# Patient Record
Sex: Male | Born: 1965 | Race: White | Hispanic: No | Marital: Married | Smoking: Never smoker
Health system: Southern US, Community
[De-identification: ages and names within clinical notes are randomized; demographics above are authoritative.]

## PROBLEM LIST (undated history)

## (undated) DIAGNOSIS — E119 Type 2 diabetes mellitus without complications: Secondary | ICD-10-CM

## (undated) DIAGNOSIS — I10 Essential (primary) hypertension: Secondary | ICD-10-CM

## (undated) DIAGNOSIS — E669 Obesity, unspecified: Secondary | ICD-10-CM

## (undated) DIAGNOSIS — I82409 Acute embolism and thrombosis of unspecified deep veins of unspecified lower extremity: Secondary | ICD-10-CM

## (undated) DIAGNOSIS — I2699 Other pulmonary embolism without acute cor pulmonale: Secondary | ICD-10-CM

## (undated) DIAGNOSIS — K219 Gastro-esophageal reflux disease without esophagitis: Secondary | ICD-10-CM

## (undated) DIAGNOSIS — E7800 Pure hypercholesterolemia, unspecified: Secondary | ICD-10-CM

## (undated) HISTORY — PX: HERNIA REPAIR: SHX51

## (undated) HISTORY — PX: CHOLECYSTECTOMY: SHX55

## (undated) HISTORY — PX: FOOT FRACTURE SURGERY: SHX645

## (undated) HISTORY — PX: KNEE SURGERY: SHX244

---

## 2006-10-11 ENCOUNTER — Emergency Department (HOSPITAL_COMMUNITY): Admission: EM | Admit: 2006-10-11 | Discharge: 2006-10-11 | Payer: Self-pay | Admitting: Emergency Medicine

## 2014-11-05 ENCOUNTER — Emergency Department (HOSPITAL_COMMUNITY)
Admission: EM | Admit: 2014-11-05 | Discharge: 2014-11-05 | Disposition: A | Discharge status: Home / Self Care | Payer: BC Managed Care – PPO | Attending: Emergency Medicine | Admitting: Emergency Medicine

## 2014-11-05 ENCOUNTER — Encounter (HOSPITAL_COMMUNITY): Payer: Self-pay | Admitting: *Deleted

## 2014-11-05 DIAGNOSIS — Z7952 Long term (current) use of systemic steroids: Secondary | ICD-10-CM | POA: Insufficient documentation

## 2014-11-05 DIAGNOSIS — E119 Type 2 diabetes mellitus without complications: Secondary | ICD-10-CM | POA: Insufficient documentation

## 2014-11-05 DIAGNOSIS — K219 Gastro-esophageal reflux disease without esophagitis: Secondary | ICD-10-CM | POA: Diagnosis not present

## 2014-11-05 DIAGNOSIS — L509 Urticaria, unspecified: Secondary | ICD-10-CM | POA: Insufficient documentation

## 2014-11-05 DIAGNOSIS — E7800 Pure hypercholesterolemia, unspecified: Secondary | ICD-10-CM | POA: Insufficient documentation

## 2014-11-05 DIAGNOSIS — Z79899 Other long term (current) drug therapy: Secondary | ICD-10-CM | POA: Diagnosis not present

## 2014-11-05 DIAGNOSIS — R21 Rash and other nonspecific skin eruption: Secondary | ICD-10-CM | POA: Diagnosis present

## 2014-11-05 DIAGNOSIS — E1169 Type 2 diabetes mellitus with other specified complication: Secondary | ICD-10-CM | POA: Insufficient documentation

## 2014-11-05 DIAGNOSIS — I10 Essential (primary) hypertension: Secondary | ICD-10-CM | POA: Insufficient documentation

## 2014-11-05 DIAGNOSIS — Z91010 Allergy to peanuts: Secondary | ICD-10-CM | POA: Diagnosis present

## 2014-11-05 DIAGNOSIS — T782XXA Anaphylactic shock, unspecified, initial encounter: Secondary | ICD-10-CM

## 2014-11-05 DIAGNOSIS — E669 Obesity, unspecified: Secondary | ICD-10-CM | POA: Insufficient documentation

## 2014-11-05 DIAGNOSIS — T7801XA Anaphylactic reaction due to peanuts, initial encounter: Secondary | ICD-10-CM | POA: Insufficient documentation

## 2014-11-05 HISTORY — DX: Obesity, unspecified: E66.9

## 2014-11-05 HISTORY — DX: Pure hypercholesterolemia, unspecified: E78.00

## 2014-11-05 HISTORY — DX: Essential (primary) hypertension: I10

## 2014-11-05 HISTORY — DX: Gastro-esophageal reflux disease without esophagitis: K21.9

## 2014-11-05 HISTORY — DX: Type 2 diabetes mellitus without complications: E11.9

## 2014-11-05 MED ORDER — EPINEPHRINE 0.3 MG/0.3ML IJ SOAJ
0.3000 mg | Freq: Once | INTRAMUSCULAR | Status: AC
  Administered 2014-11-05: 0.3 mg via INTRAMUSCULAR

## 2014-11-05 MED ORDER — SODIUM CHLORIDE 0.9 % IV SOLN
1000.0000 mL | Freq: Once | INTRAVENOUS | Status: AC
  Administered 2014-11-05: 1000 mL via INTRAVENOUS

## 2014-11-05 MED ORDER — METHYLPREDNISOLONE SODIUM SUCC 125 MG IJ SOLR
125.0000 mg | Freq: Once | INTRAMUSCULAR | Status: AC
  Administered 2014-11-05: 125 mg via INTRAVENOUS
  Filled 2014-11-05: qty 2

## 2014-11-05 MED ORDER — SODIUM CHLORIDE 0.9 % IV SOLN
1000.0000 mL | INTRAVENOUS | Status: DC
  Administered 2014-11-05: 1000 mL via INTRAVENOUS

## 2014-11-05 MED ORDER — FAMOTIDINE IN NACL 20-0.9 MG/50ML-% IV SOLN
20.0000 mg | Freq: Once | INTRAVENOUS | Status: AC
  Administered 2014-11-05: 20 mg via INTRAVENOUS
  Filled 2014-11-05: qty 50

## 2014-11-05 MED ORDER — DIPHENHYDRAMINE HCL 25 MG PO TABS: 25.0000 mg | ORAL_TABLET | Freq: Four times a day (QID) | ORAL | Status: DC

## 2014-11-05 MED ORDER — DIPHENHYDRAMINE HCL 50 MG/ML IJ SOLN
12.5000 mg | Freq: Once | INTRAMUSCULAR | Status: AC
  Administered 2014-11-05: 12.5 mg via INTRAVENOUS
  Filled 2014-11-05: qty 1

## 2014-11-05 MED ORDER — PREDNISONE 20 MG PO TABS: ORAL_TABLET | ORAL | Status: DC

## 2014-11-05 MED ORDER — FAMOTIDINE 20 MG PO TABS: 20.0000 mg | ORAL_TABLET | Freq: Two times a day (BID) | ORAL | Status: DC

## 2014-11-05 MED ORDER — EPINEPHRINE 0.3 MG/0.3ML IJ SOAJ
INTRAMUSCULAR | Status: AC
  Filled 2014-11-05: qty 0.3

## 2014-11-05 NOTE — ED Notes (Signed)
Patient reports that symptoms have resolved, only stated that he feels "sleepy" due to admin of Benadryl Patient in NAD Patient's wife at the bedside Side rails up, call bell in reach

## 2014-11-05 NOTE — ED Notes (Signed)
Patient states that he has an Epi pen, but did not use it

## 2014-11-05 NOTE — Discharge Instructions (Signed)
Anaphylactic Reaction An anaphylactic reaction is a sudden, severe allergic reaction. It affects the whole body. It can be life threatening. You may need to stay in the hospital.  [place] a medical bracelet or necklace that lists your allergy.  Carry your allergy kit or medicine shot to treat severe allergic reactions with you. These can save your life.  Do not drive until medicine from your shot has worn off, unless your doctor says it is okay.  If you have hives or a rash:  Take medicine as told by your doctor.  You may take over-the-counter antihistamine medicine.  Place cold cloths on your skin. Take baths in cool water. Avoid hot baths and hot showers. GET HELP RIGHT AWAY IF:   Your mouth is puffy (swollen), or you have trouble breathing.  You start making whistling sounds when you breathe (wheezing).  You have a tight feeling in your chest or throat.  You have a rash, hives, puffiness, or itching on your body.  You throw up (vomit) or have watery poop (diarrhea).  You feel dizzy or pass out (faint).  You think you are having an allergic reaction.  You have new symptoms. This is an emergency. Use your medicine shot or allergy kit as told. Call your local emergency services (911 in [place] Even if you feel better after the shot, you need to go to the hospital emergency department. MAKE SURE YOU:   Understand these instructions.  Will watch your condition.  Will get help right away if you are not doing well or get worse. Document Released: 05/31/2008 Document Revised: 06/13/2012 Document Reviewed: 03/15/2012 [facility] Patient Information 2015 [place], [place]. This information is not intended to replace advice given to you by your health care provider. Make sure you discuss any questions you have with your health care provider. Epinephrine Injection Epinephrine is a medicine given by injection to temporarily treat an emergency allergic reaction. It is also used to  treat severe asthmatic attacks and other lung problems. The medicine helps to enlarge (dilate) the small breathing tubes of the lungs. A life-threatening, sudden allergic reaction that involves the whole body is called anaphylaxis. Because of potential side effects, epinephrine should only be used as directed by your caregiver. RISKS AND COMPLICATIONS Possible side effects of epinephrine injections include: Chest pain. Irregular or rapid heartbeat. Shortness of breath. Nausea. Vomiting. Abdominal pain or cramping. Sweating. Dizziness. Weakness. Headache. Nervousness. Report all side effects to your caregiver. HOW TO GIVE AN EPINEPHRINE INJECTION Give the epinephrine injection immediately when symptoms of a severe reaction begin. Inject the medicine into the outer thigh or any available, large muscle. Your caregiver can teach you how to do this. You do not need to remove any clothing. After the injection, call your local emergency services (911 in [place] Even if you improve after the injection, you need to be examined at a hospital emergency department. Epinephrine works quickly, but it also wears off quickly. Delayed reactions can occur. A delayed reaction may be as serious and dangerous as the initial reaction. HOME CARE INSTRUCTIONS Make sure you and your family know how to give an epinephrine injection. Use epinephrine injections as directed by your caregiver. Do not use this medicine more often or in larger doses than prescribed. Always carry your epinephrine injection or anaphylaxis kit with you. This can be lifesaving if you have a severe reaction. Store the medicine in a cool, dry place. If the medicine becomes discolored or cloudy, dispose of it properly  and replace it with new medicine. Check the expiration date on your medicine. It may be unsafe to use medicines past their expiration date. Tell your caregiver about any other medicines you are taking. Some medicines can react badly  with epinephrine. Tell your caregiver about any medical conditions you have, such as diabetes, high blood pressure (hypertension), heart disease, irregular heartbeats, or if you are pregnant. SEEK IMMEDIATE MEDICAL CARE IF: You have used an epinephrine injection. Call your local emergency services (911 in [place] Even if you improve after the injection, you need to be examined at a hospital emergency department to make sure your allergic reaction is under control. You will also be monitored for adverse effects from the medicine. You have chest pain. You have irregular or fast heartbeats. You have shortness of breath. You have severe headaches. You have severe nausea, vomiting, or abdominal cramps. You have severe pain, swelling, or redness in the area where you gave the injection. Document Released: 12/10/2000 Document Revised: 03/06/2012 Document Reviewed: 09/01/2011 [facility] Patient Information 2015 [place], [place]. This information is not intended to replace advice given to you by your health care provider. Make sure you discuss any questions you have with your health care provider.

## 2014-11-05 NOTE — ED Provider Notes (Signed)
CSN: [id number]     Arrival date & time 11/05/14  [id number] History   First MD Initiated Contact with Patient 11/05/14 [phone]     Chief Complaint  Patient presents with  . Allergic Reaction     (Consider location/radiation/quality/duration/timing/severity/associated sxs/prior Treatment) Patient is a 48 y.o. male presenting with allergic reaction. The history is provided by the patient. No language interpreter was used.  Allergic Reaction Presenting symptoms: difficulty breathing, itching and rash   Presenting symptoms: no difficulty swallowing   Difficulty breathing:    Severity:  Moderate   Onset quality:  Sudden   Duration:  1 hour   Timing:  Constant   Progression:  Unchanged Itching:    Location:  Full body   Severity:  Severe   Onset quality:  Sudden   Duration:  1 hour   Timing:  Constant   Progression:  Unchanged Rash:    Location:  Arm and leg   Quality: itchiness and redness     Severity:  Moderate   Onset quality:  Sudden   Duration:  1 hour   Timing:  Constant   Progression:  Unchanged Severity:  Severe Prior allergic episodes:  Food/nut allergies Context: food   Relieved by:  Nothing Worsened by:  Nothing tried Ineffective treatments: xopenex and zyrtec.   Past Medical History  Diagnosis Date  . Diabetes mellitus without complication   . Obesity   . GERD (gastroesophageal reflux disease)   . Hypercholesteremia   . Hypertension    History reviewed. No pertinent past surgical history. History reviewed. No pertinent family history. History  Substance Use Topics  . Smoking status: Never Smoker   . Smokeless tobacco: Not on file  . Alcohol Use: No    Review of Systems  Constitutional: Negative for fever, activity change, appetite change and fatigue.  HENT: Negative for congestion, facial swelling, rhinorrhea and trouble swallowing.   Eyes: Negative for photophobia and pain.  Respiratory: Negative for cough, chest tightness and shortness of breath.    Cardiovascular: Negative for chest pain and leg swelling.  Gastrointestinal: Negative for nausea, vomiting, abdominal pain, diarrhea and constipation.  Endocrine: Negative for polydipsia and polyuria.  Genitourinary: Negative for dysuria, urgency, decreased urine volume and difficulty urinating.  Musculoskeletal: Negative for back pain and gait problem.  Skin: Positive for itching and rash. Negative for color change and wound.  Allergic/Immunologic: Negative for immunocompromised state.  Neurological: Negative for dizziness, facial asymmetry, speech difficulty, weakness, numbness and headaches.  Psychiatric/Behavioral: Negative for confusion, decreased concentration and agitation.      Allergies  Peanuts  Home Medications   Prior to Admission medications   Medication Sig Start Date End Date Taking? Authorizing Provider  cetirizine (ZYRTEC) 1 MG/ML syrup Take 5 mg by mouth daily as needed (for alleriges).   Yes Historical Provider, MD  EPINEPHrine (EPIPEN 2-PAK) 0.3 mg/0.3 mL IJ SOAJ injection Inject 0.3 mg into the muscle once.   Yes Historical Provider, MD  esomeprazole (NEXIUM) 40 MG capsule Take 40 mg by mouth at bedtime.   Yes Historical Provider, MD  Fenofibrate 150 MG CAPS Take 150 mg by mouth at bedtime.   Yes Historical Provider, MD  levalbuterol [facility](XOPENEX HFA) 45 MCG/ACT inhaler Inhale 2 puffs into the lungs every 4 (four) hours as needed for wheezing.   Yes Historical Provider, MD  SitaGLIPtin-MetFORMIN HCl (JANUMET XR) 50-1000 MG TB24 Take 1 tablet by mouth 2 (two) times daily.   Yes Historical Provider, MD  diphenhydrAMINE (BENADRYL) 25 MG tablet  Take 1 tablet (25 mg total) by mouth every 6 (six) hours. 11/05/14   Toy CookeyMegan Docherty, MD  famotidine (PEPCID) 20 MG tablet Take 1 tablet (20 mg total) by mouth 2 (two) times daily. For 3 days 11/05/14   Toy CookeyMegan Docherty, MD  predniSONE (DELTASONE) 20 MG tablet 1 tab daily for 3 days 11/05/14   Toy CookeyMegan Docherty, MD  sitaGLIPtin-metformin  (JANUMET) 50-1000 MG per tablet Take 1 tablet by mouth 2 (two) times daily with a meal.    Historical Provider, MD   BP 138/68 mmHg  Pulse 83  Temp(Src) 97.7 F (36.5 C) (Oral)  Resp 17  SpO2 100% Physical Exam  Constitutional: He is oriented to person, place, and time. He appears well-developed and well-nourished. No distress.  HENT:  Head: Normocephalic and atraumatic.  Mouth/Throat: No oropharyngeal exudate.  Eyes: Pupils are equal, round, and reactive to light.  Neck: Normal range of motion. Neck supple.  Cardiovascular: Normal rate, regular rhythm and normal heart sounds.  Exam reveals no gallop and no friction rub.   No murmur heard. Pulmonary/Chest: Effort normal and breath sounds normal. No respiratory distress. He has no wheezes. He has no rales.  Abdominal: Soft. Bowel sounds are normal. He exhibits no distension and no mass. There is no tenderness. There is no rebound and no guarding.  Musculoskeletal: Normal range of motion. He exhibits no edema or tenderness.  Neurological: He is alert and oriented to person, place, and time.  Skin: Skin is warm and dry. Rash noted. Rash is urticarial (BLUE).  Psychiatric: He has a normal mood and affect.    ED Course  Procedures (including critical care time) Labs Review Labs Reviewed - No data to display  Imaging Review No results found.   EKG Interpretation None      MDM   Final diagnoses:  Anaphylaxis, initial encounter    Pt is a 48 y.o. male with Pmhx as above who presents with anaphylaxis. He states he thinks he was exposed to peanuts at work today as he had sudden onset she throat, shortness of breath, itchy skin. He states he took 2 puffs of his Xopenex inhaler and liquid Zyrtec and has felt only mildly improved. 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 On physical exam patient is mildly tachycardic but 99% on room air.  He is coughing. Posterior oropharynx is clear and open. He has mild urticaria to bilateral upper extremities. Patient will be treated with EpiPen, IV Benadryl, IV famotidine, IV Solu-Medrol. He'll be closely monitored for change in symptom progression. If symptoms resolve he will be observed in the ED for 4 hours..  Symptoms resolved after 4 hrs. Will d/c home w/ 3 days of pred, benadryl, pepcid.       Toy CookeyMegan Docherty, MD 11/05/14 [id number]

## 2014-11-05 NOTE — ED Notes (Signed)
Patient reports that all symptoms r/t allergic reaction have resolved Patient denies SOB, itching of skin or throat or feeling as if throat is closing up RR WNL--even and unlabored with equal rise and fall of chest LCTA bilaterally Patient alert and oriented x 4 at time of DC home Wife at bedside will provide transportation

## 2014-11-05 NOTE — ED Notes (Signed)
EDP at bedside Patient also states that his arms and legs feel itchy

## 2014-11-05 NOTE — ED Notes (Signed)
Patient arrives via private vehicle Patient states that he is allergic to peanuts and that "someone did something in my office, because now my throat feels itching and like I'm choking" Patient with O2 sat of 99% on room air Patient states that he used his Xopenex inhaler and liquid Zyrtec after symptoms began, because "That's what my doctor's have told me to do." Patient reports that he is having a hard time breathing Patient able to speak in full, complete sentences without difficulty--handles secretions

## 2014-11-05 NOTE — ED Notes (Signed)
Patient reports that symptoms are beginning to resolve, but states he is "jittery" Patient informed of side effects of Epi pen--agree and v/u Patient remains 97-99% O2 sat on 2L [place] Patient in NAD Side rails up, call bell in reach Will monitor closely

## 2014-11-05 NOTE — ED Notes (Signed)
Patient continues to report that he throat is "feeling better" RR WNL--even and unlabored with equal rise and fall of chest Patient in NAD

## 2016-03-26 ENCOUNTER — Other Ambulatory Visit (HOSPITAL_COMMUNITY): Payer: Self-pay | Admitting: Specialist

## 2016-03-26 ENCOUNTER — Ambulatory Visit (HOSPITAL_COMMUNITY)
Admission: RE | Admit: 2016-03-26 | Discharge: 2016-03-26 | Disposition: A | Discharge status: Home / Self Care | Payer: 59 | Source: Ambulatory Visit | Attending: Specialist | Admitting: Specialist

## 2016-03-26 DIAGNOSIS — I10 Essential (primary) hypertension: Secondary | ICD-10-CM | POA: Insufficient documentation

## 2016-03-26 DIAGNOSIS — E119 Type 2 diabetes mellitus without complications: Secondary | ICD-10-CM | POA: Insufficient documentation

## 2016-03-26 DIAGNOSIS — M79662 Pain in left lower leg: Secondary | ICD-10-CM

## 2016-03-26 DIAGNOSIS — M7989 Other specified soft tissue disorders: Principal | ICD-10-CM

## 2016-03-26 DIAGNOSIS — K219 Gastro-esophageal reflux disease without esophagitis: Secondary | ICD-10-CM | POA: Insufficient documentation

## 2016-03-26 DIAGNOSIS — E669 Obesity, unspecified: Secondary | ICD-10-CM | POA: Diagnosis not present

## 2016-03-26 DIAGNOSIS — M79605 Pain in left leg: Secondary | ICD-10-CM | POA: Diagnosis present

## 2016-03-26 DIAGNOSIS — I8392 Asymptomatic varicose veins of left lower extremity: Secondary | ICD-10-CM | POA: Insufficient documentation

## 2016-03-26 DIAGNOSIS — E7800 Pure hypercholesterolemia, unspecified: Secondary | ICD-10-CM | POA: Diagnosis not present

## 2016-03-26 NOTE — Progress Notes (Signed)
*  PRELIMINARY RESULTS* Vascular Ultrasound Left lower extremity venous duplex has been completed.  Preliminary findings: no evidence of DVT or superficial thrombosis. Varicose veins noted in left calf.  Called results to Dr. Thomasena Edisollins.   Farrel DemarkJill Eunice, RDMS, RVT  03/26/2016, 3:56 PM

## 2017-04-28 ENCOUNTER — Ambulatory Visit: Payer: Self-pay | Admitting: Cardiology

## 2017-04-30 DIAGNOSIS — Z8249 Family history of ischemic heart disease and other diseases of the circulatory system: Secondary | ICD-10-CM | POA: Insufficient documentation

## 2017-06-09 ENCOUNTER — Ambulatory Visit: Payer: Self-pay | Admitting: Cardiology

## 2017-06-15 ENCOUNTER — Encounter: Payer: Self-pay | Admitting: Cardiology

## 2018-07-12 ENCOUNTER — Emergency Department (HOSPITAL_BASED_OUTPATIENT_CLINIC_OR_DEPARTMENT_OTHER)
Admission: EM | Admit: 2018-07-12 | Discharge: 2018-07-12 | Disposition: A | Discharge status: Home / Self Care | Payer: 59 | Attending: Emergency Medicine | Admitting: Emergency Medicine

## 2018-07-12 ENCOUNTER — Emergency Department (HOSPITAL_BASED_OUTPATIENT_CLINIC_OR_DEPARTMENT_OTHER): Payer: 59

## 2018-07-12 ENCOUNTER — Encounter (HOSPITAL_BASED_OUTPATIENT_CLINIC_OR_DEPARTMENT_OTHER): Payer: Self-pay | Admitting: Emergency Medicine

## 2018-07-12 ENCOUNTER — Other Ambulatory Visit: Payer: Self-pay

## 2018-07-12 DIAGNOSIS — I10 Essential (primary) hypertension: Secondary | ICD-10-CM | POA: Insufficient documentation

## 2018-07-12 DIAGNOSIS — Z79899 Other long term (current) drug therapy: Secondary | ICD-10-CM | POA: Diagnosis not present

## 2018-07-12 DIAGNOSIS — Z7984 Long term (current) use of oral hypoglycemic drugs: Secondary | ICD-10-CM | POA: Insufficient documentation

## 2018-07-12 DIAGNOSIS — R0789 Other chest pain: Secondary | ICD-10-CM | POA: Diagnosis present

## 2018-07-12 DIAGNOSIS — E119 Type 2 diabetes mellitus without complications: Secondary | ICD-10-CM | POA: Insufficient documentation

## 2018-07-12 DIAGNOSIS — R091 Pleurisy: Secondary | ICD-10-CM | POA: Insufficient documentation

## 2018-07-12 LAB — CBC WITH DIFFERENTIAL/PLATELET
Basophils Absolute: 0.1 10*3/uL (ref 0.0–0.1)
Basophils Relative: 1 %
EOS ABS: 0.3 10*3/uL (ref 0.0–0.7)
Eosinophils Relative: 2 %
HEMATOCRIT: 38.5 % — AB (ref 39.0–52.0)
HEMOGLOBIN: 13.6 g/dL (ref 13.0–17.0)
LYMPHS ABS: 2.8 10*3/uL (ref 0.7–4.0)
Lymphocytes Relative: 26 %
MCH: 29.4 pg (ref 26.0–34.0)
MCHC: 35.3 g/dL (ref 30.0–36.0)
MCV: 83.2 fL (ref 78.0–100.0)
Monocytes Absolute: 0.8 10*3/uL (ref 0.1–1.0)
Monocytes Relative: 8 %
NEUTROS ABS: 6.8 10*3/uL (ref 1.7–7.7)
Neutrophils Relative %: 63 %
Platelets: 289 10*3/uL (ref 150–400)
RBC: 4.63 MIL/uL (ref 4.22–5.81)
RDW: 12.8 % (ref 11.5–15.5)
WBC: 10.8 10*3/uL — ABNORMAL HIGH (ref 4.0–10.5)

## 2018-07-12 LAB — BASIC METABOLIC PANEL
ANION GAP: 8 (ref 5–15)
BUN: 13 mg/dL (ref 6–20)
CALCIUM: 8.8 mg/dL — AB (ref 8.9–10.3)
CHLORIDE: 98 mmol/L (ref 98–111)
CO2: 28 mmol/L (ref 22–32)
Creatinine, Ser: 1.01 mg/dL (ref 0.61–1.24)
GFR calc non Af Amer: >60 mL/min (ref >60)
GLUCOSE: 207 mg/dL — AB (ref 70–99)
Potassium: 3.7 mmol/L (ref 3.5–5.1)
Sodium: 134 mmol/L — ABNORMAL LOW (ref 135–145)

## 2018-07-12 LAB — TROPONIN I: Troponin I: <0.03 ng/mL (ref <0.03)

## 2018-07-12 LAB — D-DIMER, QUANTITATIVE: D-Dimer, Quant: 0.28 ug/mL-FEU (ref 0.00–0.50)

## 2018-07-12 MED ORDER — SUCRALFATE 1 GM/10ML PO SUSP
1.0000 g | Freq: Once | ORAL | Status: AC
  Administered 2018-07-12: 1 g via ORAL
  Filled 2018-07-12: qty 10

## 2018-07-12 MED ORDER — NAPROXEN 375 MG PO TABS: ORAL_TABLET | ORAL | 0 refills | Status: DC

## 2018-07-12 MED ORDER — HYDROCODONE-ACETAMINOPHEN 5-325 MG PO TABS: 1.0000 | ORAL_TABLET | Freq: Four times a day (QID) | ORAL | 0 refills | Status: DC | PRN

## 2018-07-12 MED ORDER — FENTANYL CITRATE (PF) 100 MCG/2ML IJ SOLN
100.0000 ug | Freq: Once | INTRAMUSCULAR | Status: AC
  Administered 2018-07-12: 100 ug via INTRAVENOUS
  Filled 2018-07-12: qty 2

## 2018-07-12 MED ORDER — IOPAMIDOL (ISOVUE-370) INJECTION 76%
100.0000 mL | Freq: Once | INTRAVENOUS | Status: AC | PRN
  Administered 2018-07-12: 100 mL via INTRAVENOUS

## 2018-07-12 NOTE — ED Provider Notes (Signed)
[facility] Provider Note: Terry Dell, MD, FACEP  CSN: [id number] MRN: [id number] ARRIVAL: 07/12/18 at 0050 ROOM: [facility]   CHIEF COMPLAINT  Chest Pain   HISTORY OF PRESENT ILLNESS  07/12/18 1:49 AM Terry Barnett is a 52 y.o. male who had the sudden onset of left parasternal chest pain while sitting on the couch about 10:30 PM.  He describes the pain is sharp.  It is pleuritic, worse with deep breathing and certain movements.  There is no shortness of breath but he has difficulty taking a deep breath due to the pain.  He describes the pain as an 8 out of 10 right now though it has had some waxing and waning over the past several hours.  He has had no associated nausea, diaphoresis, leg swelling or pain.  He denies recent travel.  He took 2 omeprazole capsules and 2 aspirin tablets over the past several hours without relief.   Past Medical History:  Diagnosis Date  . Diabetes mellitus without complication (HCC)   . GERD (gastroesophageal reflux disease)   . Hypercholesteremia   . Hypertension   . Obesity     Past Surgical History:  Procedure Laterality Date  . CHOLECYSTECTOMY    . FOOT FRACTURE SURGERY    . HERNIA REPAIR    . KNEE SURGERY      Family History  Problem Relation Age of Onset  . Heart attack Father   . Hypertension Father     Social History   Tobacco Use  . Smoking status: Never Smoker  . Smokeless tobacco: Never Used  Substance Use Topics  . Alcohol use: No  . Drug use: No    Prior to Admission medications   Medication Sig Start Date End Date Taking? Authorizing Provider  atorvastatin (LIPITOR) 80 MG tablet Take 80 mg by mouth daily.   Yes [provider]  esomeprazole (NEXIUM) 40 MG capsule Take 40 mg by mouth at bedtime.   Yes [provider]  lisinopril-hydrochlorothiazide (PRINZIDE,ZESTORETIC) 20-12.5 MG tablet Take 1 tablet by mouth daily.   Yes [provider]  metFORMIN (GLUCOPHAGE) 500 MG  tablet Take 1,000 mg by mouth 2 (two) times daily with a meal.   Yes [provider]  EPINEPHrine (EPIPEN 2-PAK) 0.3 mg/0.3 mL IJ SOAJ injection Inject 0.3 mg into the muscle once.    [provider]  levalbuterol Pauline Aus HFA) 45 MCG/ACT inhaler Inhale 2 puffs into the lungs every 4 (four) hours as needed for wheezing.    [provider]    Allergies Peanuts [peanut oil]   REVIEW OF SYSTEMS  Negative except as noted here or in the History of Present Illness.   PHYSICAL EXAMINATION  Initial Vital Signs Blood pressure (!) 152/94, pulse 99, temperature 98.5 F (36.9 C), temperature source Oral, resp. rate 18, height 5\' 9"  (1.753 m), weight 130.6 kg (288 lb), SpO2 99 %.  Examination General: Well-developed, well-nourished male in no acute distress; appearance consistent with age of record HENT: normocephalic; atraumatic Eyes: pupils equal, round and reactive to light; extraocular muscles intact Neck: supple Heart: regular rate and rhythm; no murmur Lungs: clear to auscultation bilaterally Chest: Nontender Abdomen: soft; nondistended; nontender; no masses or hepatosplenomegaly; bowel sounds present Extremities: No deformity; full range of motion; pulses normal; no edema; no calf tenderness Neurologic: Awake, alert and oriented; motor function intact in all extremities and symmetric; no facial droop Skin: Warm and dry Psychiatric: Normal mood and affect   RESULTS  Summary  of this visit's results, reviewed by myself:   EKG Interpretation  Date/Time:  Wednesday July 12 2018 01:00:01 EDT Ventricular Rate:  94 PR Interval:    QRS Duration: 116 QT Interval:  353 QTC Calculation: 442 R Axis:   59 Text Interpretation:  Sinus rhythm Nonspecific intraventricular conduction delay ST elevation, consider inferior injury No previous ECGs available Confirmed by Paula Libra ([id number]) on 07/12/2018 1:04:53 AM      Laboratory Studies: Results for orders placed or  performed during the hospital encounter of 07/12/18 (from the past 24 hour(s))  CBC with Differential/Platelet     Status: Abnormal   Collection Time: 07/12/18  1:23 AM  Result Value Ref Range   WBC 10.8 (H) 4.0 - 10.5 K/uL   RBC 4.63 4.22 - 5.81 MIL/uL   Hemoglobin 13.6 13.0 - 17.0 g/dL   HCT 62.1 (L) 30.8 - 65.7 %   MCV 83.2 78.0 - 100.0 fL   MCH 29.4 26.0 - 34.0 pg   MCHC 35.3 30.0 - 36.0 g/dL   RDW 84.6 96.2 - 95.2 %   Platelets 289 150 - 400 K/uL   Neutrophils Relative % 63 %   Neutro Abs 6.8 1.7 - 7.7 K/uL   Lymphocytes Relative 26 %   Lymphs Abs 2.8 0.7 - 4.0 K/uL   Monocytes Relative 8 %   Monocytes Absolute 0.8 0.1 - 1.0 K/uL   Eosinophils Relative 2 %   Eosinophils Absolute 0.3 0.0 - 0.7 K/uL   Basophils Relative 1 %   Basophils Absolute 0.1 0.0 - 0.1 K/uL  Troponin I     Status: None   Collection Time: 07/12/18  1:23 AM  Result Value Ref Range   Troponin I <0.03 <0.03 ng/mL  D-dimer, quantitative (not at [facility])     Status: None   Collection Time: 07/12/18  1:23 AM  Result Value Ref Range   D-Dimer, Quant 0.28 0.00 - 0.50 ug/mL-FEU  Basic metabolic panel     Status: Abnormal   Collection Time: 07/12/18  1:23 AM  Result Value Ref Range   Sodium 134 (L) 135 - 145 mmol/L   Potassium 3.7 3.5 - 5.1 mmol/L   Chloride 98 98 - 111 mmol/L   CO2 28 22 - 32 mmol/L   Glucose, Bld 207 (H) 70 - 99 mg/dL   BUN 13 6 - 20 mg/dL   Creatinine, Ser 8.41 0.61 - 1.24 mg/dL   Calcium 8.8 (L) 8.9 - 10.3 mg/dL   GFR calc non Af Amer >60 >60 mL/min   GFR calc Af Amer >60 >60 mL/min   Anion gap 8 5 - 15  Troponin I     Status: None   Collection Time: 07/12/18  4:28 AM  Result Value Ref Range   Troponin I <0.03 <0.03 ng/mL   Imaging Studies: Dg Chest 2 View  Result Date: 07/12/2018 CLINICAL DATA:  52 year old male with shortness of breath. EXAM: CHEST - 2 VIEW COMPARISON:  Chest radiograph dated 04/30/2017 FINDINGS: The heart size and mediastinal contours are within normal limits.  Both lungs are clear. The visualized skeletal structures are unremarkable. IMPRESSION: No active cardiopulmonary disease. Electronically Signed   By: Elgie Collard M.D.   On: 07/12/2018 01:44   Ct Angio Chest Pe W And/or Wo Contrast  Result Date: 07/12/2018 CLINICAL DATA:  52 year old male with chest pain. Concern for pulmonary embolism. EXAM: CT ANGIOGRAPHY CHEST WITH CONTRAST TECHNIQUE: Multidetector CT imaging of the chest was performed using the standard protocol during bolus  administration of intravenous contrast. Multiplanar CT image reconstructions and MIPs were obtained to evaluate the vascular anatomy. CONTRAST:  100mL ISOVUE-370 IOPAMIDOL (ISOVUE-370) INJECTION 76% COMPARISON:  Chest radiograph dated 07/12/2018 FINDINGS: Cardiovascular: There is no cardiomegaly or pericardial effusion. Coronary vascular calcification of the LAD. The thoracic aorta is unremarkable. The origins of the great vessels of the aortic arch are patent. No CT evidence of pulmonary embolism. Mediastinum/Nodes: There is no hilar or mediastinal adenopathy. Esophagus and the thyroid gland are grossly unremarkable. No mediastinal fluid collection. Lungs/Pleura: Small calcified granuloma in the left upper lobe. The lungs are clear. There is no pleural effusion or pneumothorax. The central airways are patent. Upper Abdomen: Cholecystectomy.  Fatty infiltration of the liver. Musculoskeletal: Degenerative changes of the spine. No acute osseous pathology. Review of the MIP images confirms the above findings. IMPRESSION: No acute intrathoracic pathology. No CT evidence of pulmonary embolism. Electronically Signed   By: Elgie CollardArash  Radparvar M.D.   On: 07/12/2018 03:47    ED COURSE and MDM  Nursing notes and initial vitals signs, including pulse oximetry, reviewed.  Vitals:   07/12/18 0105 07/12/18 0427 07/12/18 0430 07/12/18 0500  BP:  131/80 122/80 126/76  Pulse:  100 (!) 105 91  Resp:  17 (!) 21 (!) 21  Temp:      TempSrc:        SpO2:  98% 99% 98%  Weight: 130.6 kg (288 lb)     Height: 5\' 9"  (1.753 m)      2:38 AM Equivocal change with oral Carafate.   5:11 AM The patient's pain is pleuritic in nature.  His CT is negative for pulmonary embolism, tumor, dissection or infection.  He has had 2- troponins and an unremarkable EKG.  We will treat him for pleurisy.  [facility] [place] substances [place] reveals the patient has received 3 prescriptions for hydrocodone cough syrup and 1 prescription for hydrocodone tablets in the past 2 years.   PROCEDURES    ED DIAGNOSES     ICD-10-CM   1. Pleurisy R09.1        Paula LibraMolpus, Davyn Elsasser, MD 07/12/18 [id number]

## 2018-07-12 NOTE — ED Triage Notes (Signed)
Pt is c/o left side chest pain that started about 2.5 hrs ago while he was sitting on the couch  Pt states the pain goes into his back and neck  Pt states the pain is sharp in nature  Pt states he is unable to take a deep breath and laying down makes it worse   Pt states he had taken his medicine earlier and was going to bed but stopped in the kitchen when the pain started   Pt states he took an aspirin and an omeprozole  Pt states the pain did ease off but about an hour later it came back so he took another aspirin and omeprozole and came here

## 2018-07-12 NOTE — ED Notes (Signed)
Patient transported to X-ray 

## 2018-12-25 ENCOUNTER — Emergency Department (HOSPITAL_BASED_OUTPATIENT_CLINIC_OR_DEPARTMENT_OTHER): Payer: No Typology Code available for payment source

## 2018-12-25 ENCOUNTER — Encounter (HOSPITAL_BASED_OUTPATIENT_CLINIC_OR_DEPARTMENT_OTHER): Payer: Self-pay | Admitting: *Deleted

## 2018-12-25 ENCOUNTER — Emergency Department (HOSPITAL_BASED_OUTPATIENT_CLINIC_OR_DEPARTMENT_OTHER)
Admission: EM | Admit: 2018-12-25 | Discharge: 2018-12-26 | Disposition: A | Discharge status: Home / Self Care | Payer: No Typology Code available for payment source | Attending: Emergency Medicine | Admitting: Emergency Medicine

## 2018-12-25 ENCOUNTER — Other Ambulatory Visit: Payer: Self-pay

## 2018-12-25 DIAGNOSIS — S39012A Strain of muscle, fascia and tendon of lower back, initial encounter: Secondary | ICD-10-CM | POA: Diagnosis not present

## 2018-12-25 DIAGNOSIS — S0990XA Unspecified injury of head, initial encounter: Secondary | ICD-10-CM | POA: Diagnosis present

## 2018-12-25 DIAGNOSIS — W010XXA Fall on same level from slipping, tripping and stumbling without subsequent striking against object, initial encounter: Secondary | ICD-10-CM | POA: Diagnosis not present

## 2018-12-25 DIAGNOSIS — S161XXA Strain of muscle, fascia and tendon at neck level, initial encounter: Secondary | ICD-10-CM | POA: Insufficient documentation

## 2018-12-25 DIAGNOSIS — I10 Essential (primary) hypertension: Secondary | ICD-10-CM | POA: Diagnosis not present

## 2018-12-25 DIAGNOSIS — E119 Type 2 diabetes mellitus without complications: Secondary | ICD-10-CM | POA: Insufficient documentation

## 2018-12-25 DIAGNOSIS — Y929 Unspecified place or not applicable: Secondary | ICD-10-CM | POA: Insufficient documentation

## 2018-12-25 DIAGNOSIS — Y939 Activity, unspecified: Secondary | ICD-10-CM | POA: Insufficient documentation

## 2018-12-25 DIAGNOSIS — Y999 Unspecified external cause status: Secondary | ICD-10-CM | POA: Diagnosis not present

## 2018-12-25 DIAGNOSIS — Z79899 Other long term (current) drug therapy: Secondary | ICD-10-CM | POA: Diagnosis not present

## 2018-12-25 MED ORDER — HYDROCODONE-ACETAMINOPHEN 5-325 MG PO TABS
1.0000 | ORAL_TABLET | Freq: Once | ORAL | Status: AC
  Administered 2018-12-25: 1 via ORAL
  Filled 2018-12-25: qty 1

## 2018-12-25 NOTE — ED Notes (Signed)
Patient transported to X-ray 

## 2018-12-25 NOTE — ED Provider Notes (Signed)
[facility] EMERGENCY DEPARTMENT Provider Note   CSN: [id number] Arrival date & time: 12/25/18  1744     History   Chief Complaint Chief Complaint  Patient presents with  . Fall    HPI Manish Ruggiero III is a 52 y.o. male.  52 year old male with past medical history including hypertension, hyperlipidemia, GERD, type 2 diabetes mellitus who presents with fall.  Around 3 PM today, he was at work and slipped on a floor, falling backwards.  He struck the back of his head.  He did not lose consciousness.  He has had progressively worsening headache as well as pain on bilateral sides of his neck and low back pain.  He has been ambulatory but with pain.  He took Tylenol just after it happened.  He denies any extremity weakness or numbness.  No vision changes or vomiting.  No anticoagulant use.  The history is provided by the patient.  Fall     Past Medical History:  Diagnosis Date  . Diabetes mellitus without complication (HCC)   . GERD (gastroesophageal reflux disease)   . Hypercholesteremia   . Hypertension   . Obesity     Patient Active Problem List   Diagnosis Date Noted  . Hypertension 11/05/2014  . Hypercholesteremia 11/05/2014  . GERD (gastroesophageal reflux disease) 11/05/2014  . Diabetes (HCC) 11/05/2014  . Obesity 11/05/2014  . Allergic to peanuts 11/05/2014    Past Surgical History:  Procedure Laterality Date  . CHOLECYSTECTOMY    . FOOT FRACTURE SURGERY    . HERNIA REPAIR    . KNEE SURGERY          Home Medications    Prior to Admission medications   Medication Sig Start Date End Date Taking? Authorizing Provider  atorvastatin (LIPITOR) 80 MG tablet Take 80 mg by mouth daily.   Yes [provider]  EPINEPHrine (EPIPEN 2-PAK) 0.3 mg/0.3 mL IJ SOAJ injection Inject 0.3 mg into the muscle once.   Yes [provider]  esomeprazole (NEXIUM) 40 MG capsule Take 40 mg by mouth at bedtime.   Yes [provider]    levalbuterol (XOPENEX HFA) 45 MCG/ACT inhaler Inhale 2 puffs into the lungs every 4 (four) hours as needed for wheezing.   Yes [provider]  lisinopril-hydrochlorothiazide (PRINZIDE,ZESTORETIC) 20-12.5 MG tablet Take 1 tablet by mouth daily.   Yes [provider]  metFORMIN (GLUCOPHAGE) 500 MG tablet Take 1,000 mg by mouth 2 (two) times daily with a meal.   Yes [provider]  naproxen (NAPROSYN) 375 MG tablet Take 1 tablet twice daily as needed for chest pain. 07/12/18  Yes Molpus, John, MD  HYDROcodone-acetaminophen (NORCO) 5-325 MG tablet Take 1 tablet by mouth every 6 (six) hours as needed for severe pain. 07/12/18   Molpus, John, MD    Family History Family History  Problem Relation Age of Onset  . Heart attack Father   . Hypertension Father     Social History Social History   Tobacco Use  . Smoking status: Never Smoker  . Smokeless tobacco: Never Used  Substance Use Topics  . Alcohol use: No  . Drug use: No     Allergies   Peanuts [peanut oil]   Review of Systems Review of Systems   Physical Exam Updated Vital Signs BP 108/73 (BP Location: Right Arm)   Pulse 99   Temp 98.2 F (36.8 C) (Oral)   Resp 20   Ht 5\' 10"  (1.778 m)   Wt 124.7  kg   SpO2 99%   BMI 39.46 kg/m   Physical Exam Vitals signs and nursing note reviewed.  Constitutional:      General: He is not in acute distress.    Appearance: He is well-developed.     Comments: Awake, alert  HENT:     Head: Normocephalic and atraumatic.  Eyes:     Extraocular Movements: Extraocular movements intact.     Conjunctiva/sclera: Conjunctivae normal.     Pupils: Pupils are equal, round, and reactive to light.  Neck:     Musculoskeletal: Normal range of motion and neck supple.     Comments: Mild L paraspinal cervical muscle tenderness Cardiovascular:     Rate and Rhythm: Normal rate and regular rhythm.     Heart sounds: Normal heart sounds. No murmur.  Pulmonary:      Effort: Pulmonary effort is normal. No respiratory distress.     Breath sounds: Normal breath sounds.  Abdominal:     General: Bowel sounds are normal. There is no distension.     Palpations: Abdomen is soft.     Tenderness: There is no abdominal tenderness.  Musculoskeletal:     Comments: Tenderness across upper lumbar back without stepoff or ecchymoses  Skin:    General: Skin is warm and dry.  Neurological:     Mental Status: He is alert and oriented to person, place, and time.     Cranial Nerves: No cranial nerve deficit.     Motor: No abnormal muscle tone.     Deep Tendon Reflexes: Reflexes are normal and symmetric.     Comments: Fluent speech, normal finger-to-nose testing, negative pronator drift, no clonus 5/5 strength and normal sensation x all 4 extremities  Psychiatric:        Thought Content: Thought content normal.        Judgment: Judgment normal.      ED Treatments / Results  Labs (all labs ordered are listed, but only abnormal results are displayed) Labs Reviewed - No data to display  EKG None  Radiology Dg Lumbar Spine 2-3 Views  Result Date: 12/25/2018 CLINICAL DATA:  Fall backwards. Low back pain. EXAM: LUMBAR SPINE - 2-3 VIEW COMPARISON:  None. FINDINGS: This report assumes 5 non rib-bearing lumbar vertebrae. Lumbar vertebral body heights are preserved, with no fracture. Mild degenerative disc disease throughout the lumbar spine. No spondylolisthesis. No appreciable facet arthropathy. No aggressive appearing focal osseous lesions. IMPRESSION: 1. No lumbar spine fracture or spondylolisthesis. 2. Mild multilevel lumbar degenerative disc disease. Electronically Signed   By: Delbert PhenixJason A Poff M.D.   On: 12/25/2018 23:24   Ct Head Wo Contrast  Result Date: 12/25/2018 CLINICAL DATA:  Fall backwards with head injury. Headache. EXAM: CT HEAD WITHOUT CONTRAST TECHNIQUE: Contiguous axial images were obtained from the base of the skull through the vertex without  intravenous contrast. COMPARISON:  10/17/2008 head CT FINDINGS: Brain: No evidence of parenchymal hemorrhage or extra-axial fluid collection. No mass lesion, mass effect, or midline shift. No CT evidence of acute infarction. Cerebral volume is age appropriate. No ventriculomegaly. Vascular: No acute abnormality. Skull: No evidence of calvarial fracture. Sinuses/Orbits: No fluid levels. Mucous retention cyst versus polyp in the inferior right maxillary sinus. Other:  The mastoid air cells are unopacified. IMPRESSION: 1. No evidence of acute intracranial abnormality. No evidence of calvarial fracture. 2. Mucous retention cyst versus polyp in the inferior right maxillary sinus. Electronically Signed   By: Delbert PhenixJason A Poff M.D.   On: 12/25/2018 23:28  Ct Cervical Spine Wo Contrast  Result Date: 12/26/2018 CLINICAL DATA:  Status post fall, with neck soreness. Initial encounter. EXAM: CT CERVICAL SPINE WITHOUT CONTRAST TECHNIQUE: Multidetector CT imaging of the cervical spine was performed without intravenous contrast. Multiplanar CT image reconstructions were also generated. COMPARISON:  Cervical spine radiographs performed 10/17/2008 FINDINGS: Alignment: Normal. Skull base and vertebrae: No acute fracture. No primary bone lesion or focal pathologic process. Soft tissues and spinal canal: No prevertebral fluid or swelling. No visible canal hematoma. Disc levels: Intervertebral disc spaces are preserved. A prominent osteophyte is noted on the left at C2-C3, with narrowing of the associated bony foramen. Remaining visualized bony foramina are preserved. Upper chest: The visualized lung apices are clear. Mild calcification is noted at the carotid bifurcations bilaterally. The thyroid gland is unremarkable. Other: No additional soft tissue abnormalities are seen. IMPRESSION: 1. No evidence of fracture or subluxation along the cervical spine. 2. Prominent osteophyte on the left at C2-C3, with narrowing of the associated  bony foramen. 3. Mild calcification at the carotid bifurcations bilaterally. Carotid ultrasound could be considered for further evaluation, as deemed clinically appropriate. Electronically Signed   By: Roanna RaiderJeffery  Chang M.D.   On: 12/26/2018 00:21    Procedures Procedures (including critical care time)  Medications Ordered in ED Medications  HYDROcodone-acetaminophen (NORCO/VICODIN) 5-325 MG per tablet 1 tablet (1 tablet Oral Given 12/25/18 2302)     Initial Impression / Assessment and Plan / ED Course  I have reviewed the triage vital signs and the nursing notes.  Pertinent labs & imaging results that were available during my care of the patient were reviewed by me and considered in my medical decision making (see chart for details).    Normal neurologic exam and reassuring vital signs.  No external signs of trauma.  Obtain CT of head and C-spine which were negative for acute injury.  Also obtained lumbar plain films which were negative.  I have discussed supportive measures and discussed the possibility of a mild concussion.  I discussed expected course for postconcussive syndrome.  I extensively reviewed return precautions.  Patient voiced understanding.  Final Clinical Impressions(s) / ED Diagnoses   Final diagnoses:  Closed head injury, initial encounter  Strain of neck muscle, initial encounter  Strain of lumbar region, initial encounter    ED Discharge Orders    None       Tiffanyann Deroo, Ambrose Finlandachel Morgan, MD 12/26/18 [phone]

## 2018-12-25 NOTE — ED Triage Notes (Signed)
He slipped on a wet spot. He landed on his back. Lower back pain, headache, neck and shoulder soreness. He took Tylenol with no relief. No LOC. He is ambulatory. Alert oriented.

## 2018-12-25 NOTE — ED Notes (Signed)
Returned from xray

## 2018-12-26 NOTE — ED Notes (Signed)
ED Provider at bedside. 

## 2020-07-03 IMAGING — DX DG CHEST 2V
2 series · 2 of 2 positions shown · non-contrast
Comparison: Chest radiograph dated 04/30/2017

CLINICAL DATA: 52-year-old male with shortness of breath.

EXAM:
CHEST - 2 VIEW

[chest pa]
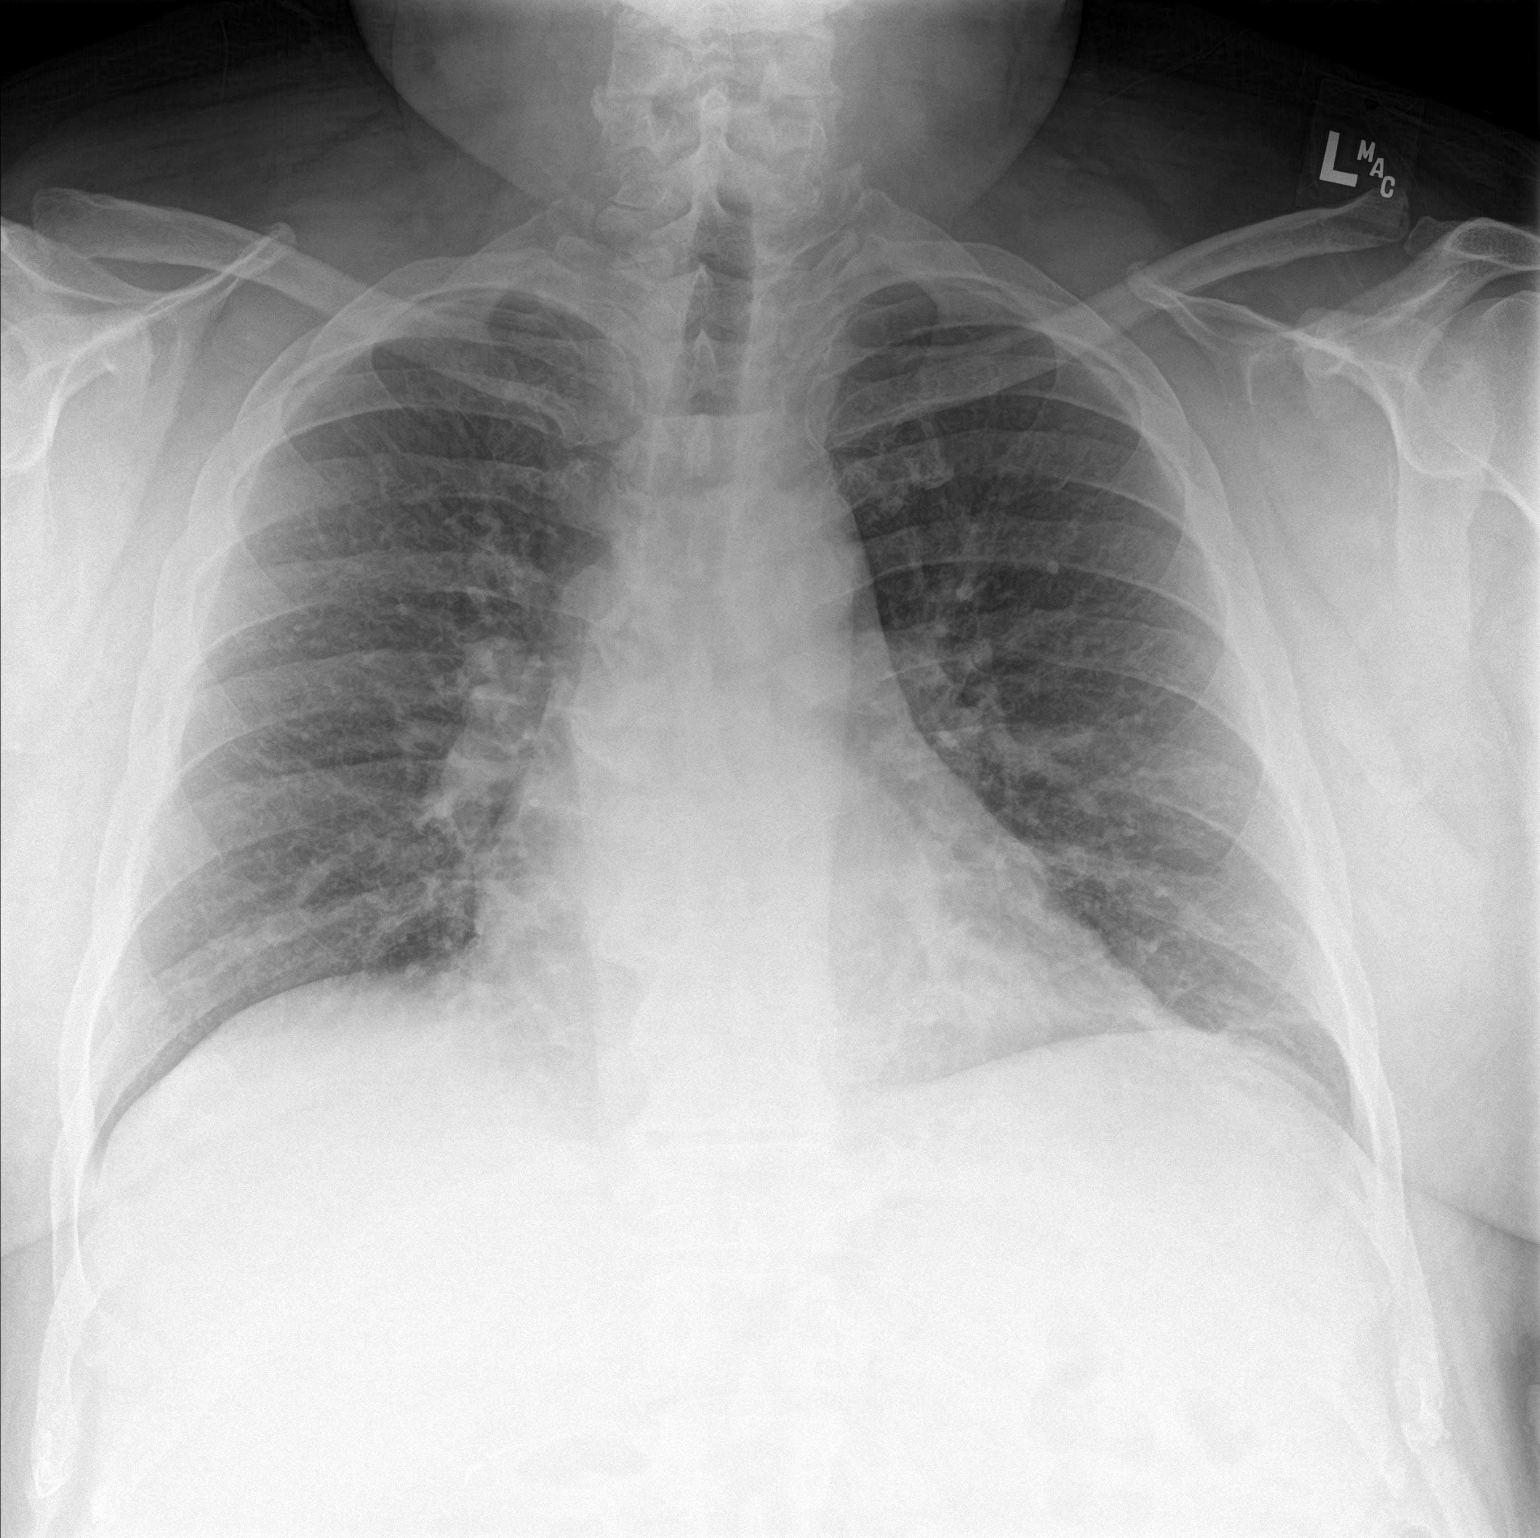

[chest lat]
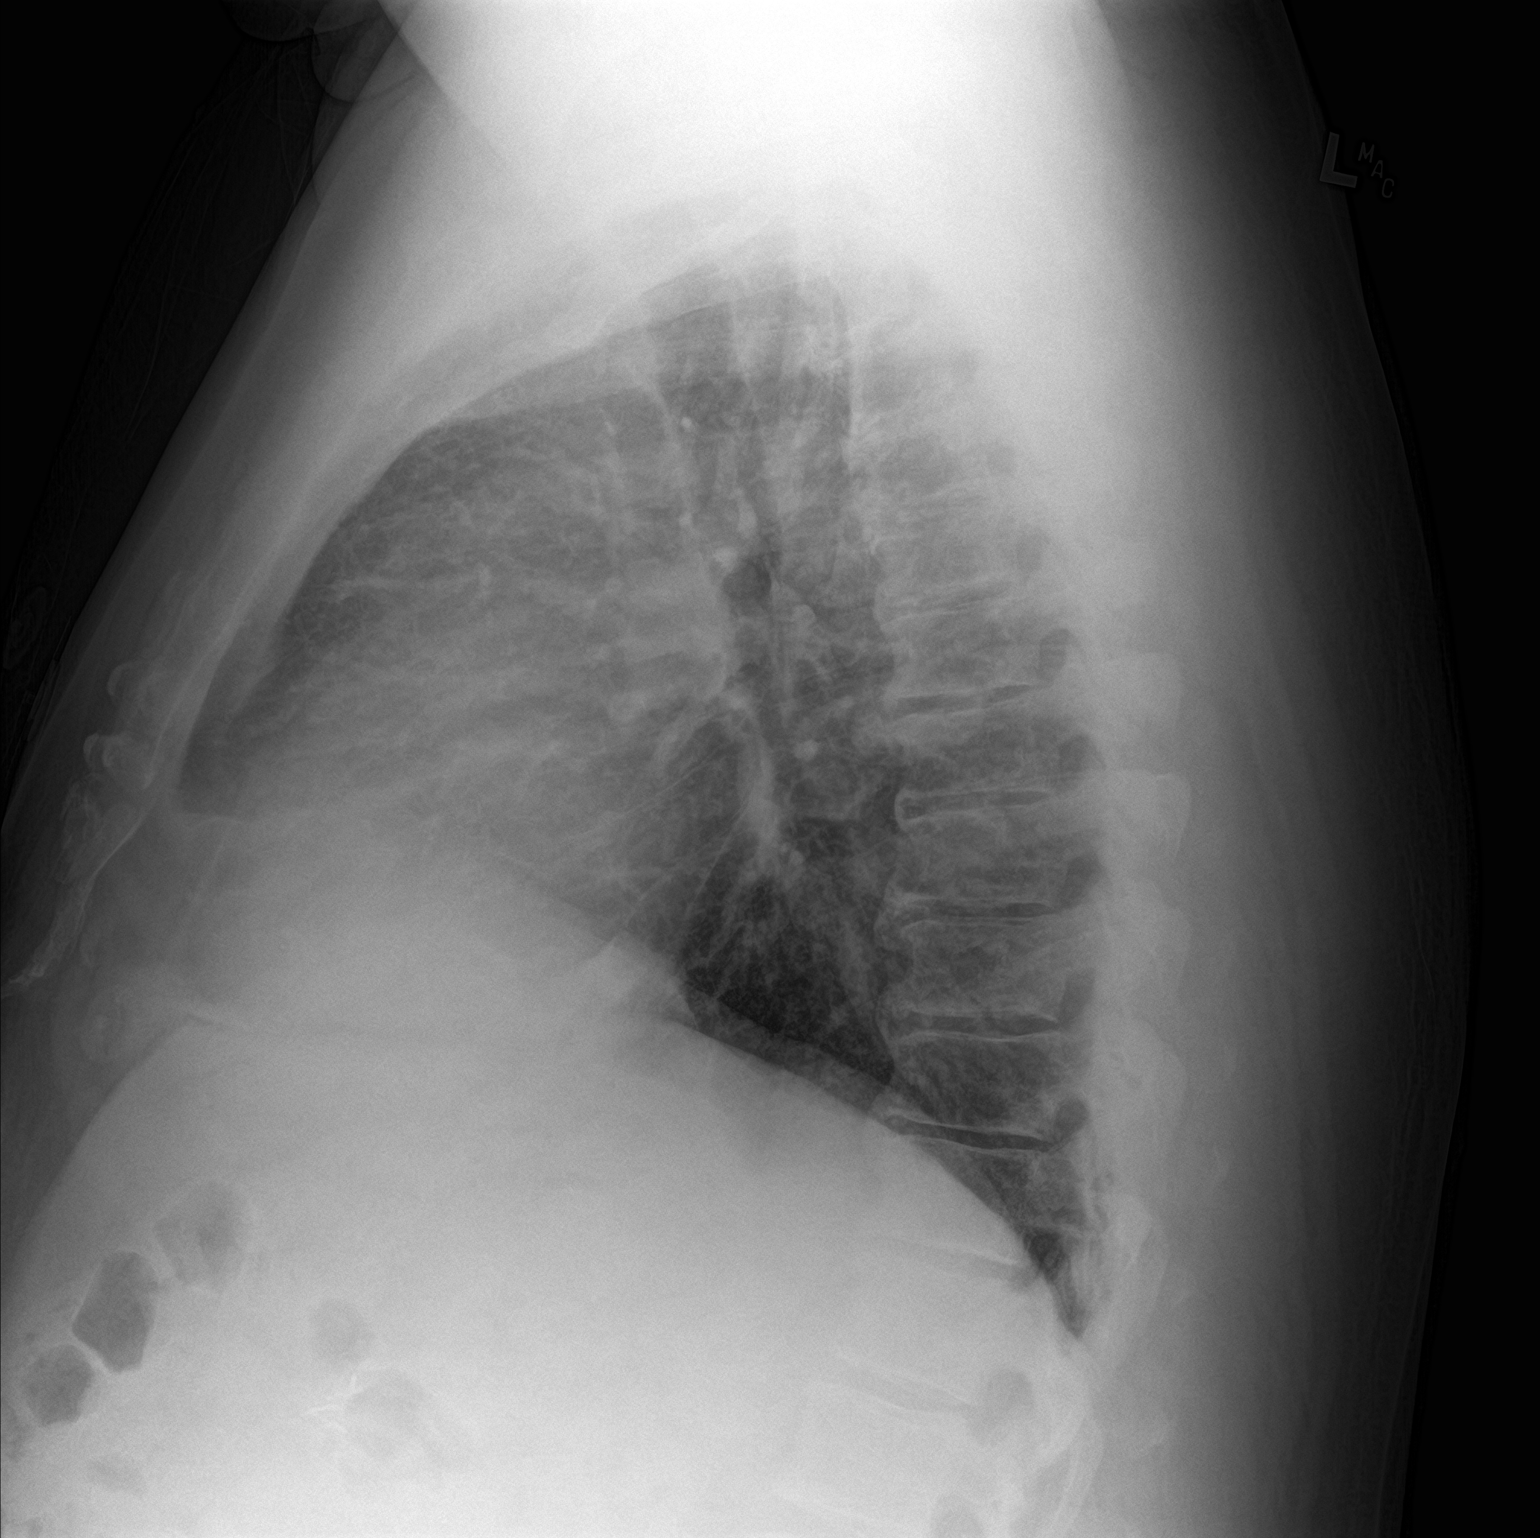

[2 of 2 positions shown; findings below may reference images not displayed]

FINDINGS: The heart size and mediastinal contours are within normal limits.
Both lungs are clear. The visualized skeletal structures are
unremarkable.
IMPRESSION: No active cardiopulmonary disease.

## 2020-12-17 IMAGING — CT CT CERVICAL SPINE W/O CM
3 of 4 series · 12 of 33 positions shown, 14 images · non-contrast
Comparison: Cervical spine radiographs performed 10/17/2008

CLINICAL DATA: Status post fall, with neck soreness. Initial
encounter.

EXAM:
CT CERVICAL SPINE WITHOUT CONTRAST
TECHNIQUE: Multidetector CT imaging of the cervical spine was performed without
intravenous contrast. Multiplanar CT image reconstructions were also
generated.

[Series 2: sagittal bone · sagittal · 0.35mm/px · 5 of 114 slices shown, 6 images]
[im 38/114  bone]
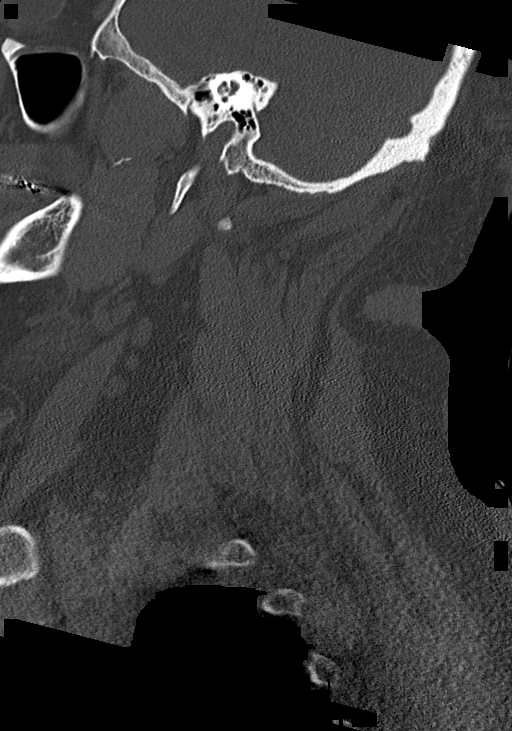
[im 48/114  bone]
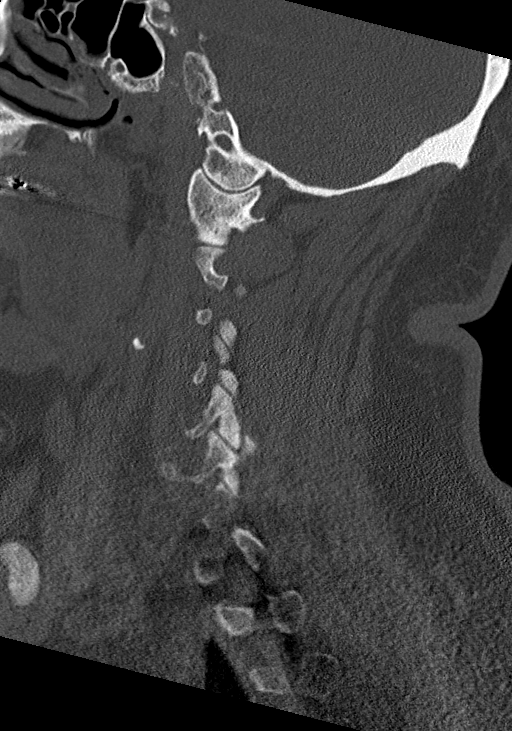
[im 57/114  soft-tissue]
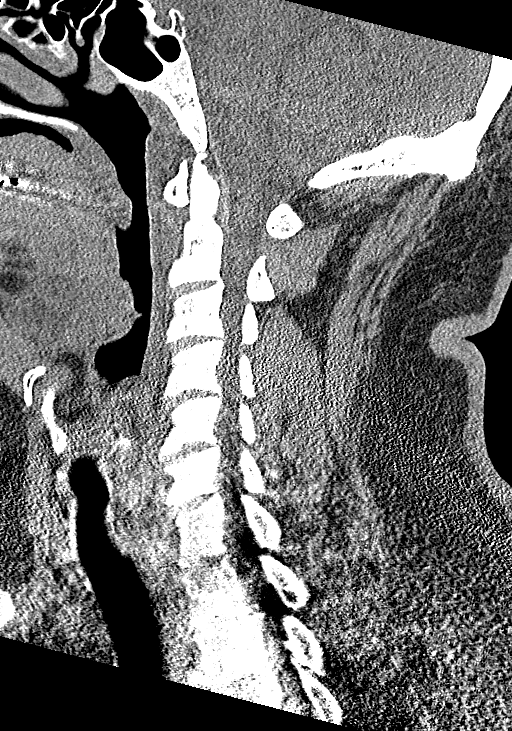
[im 57/114  bone]
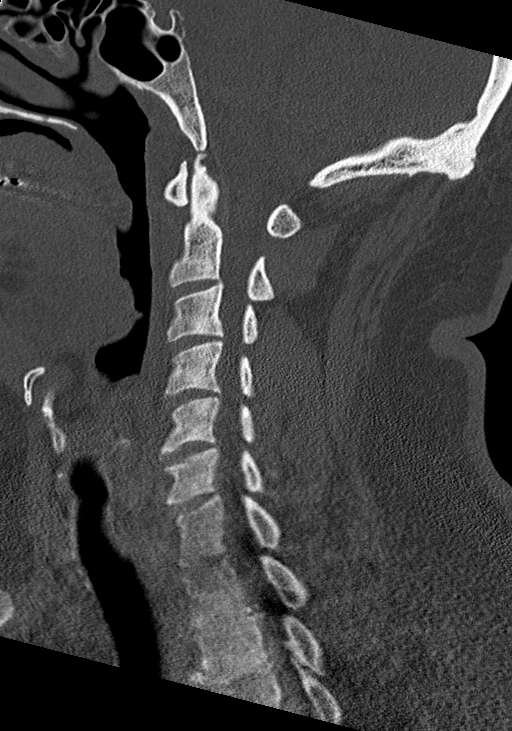
[im 66/114  bone]
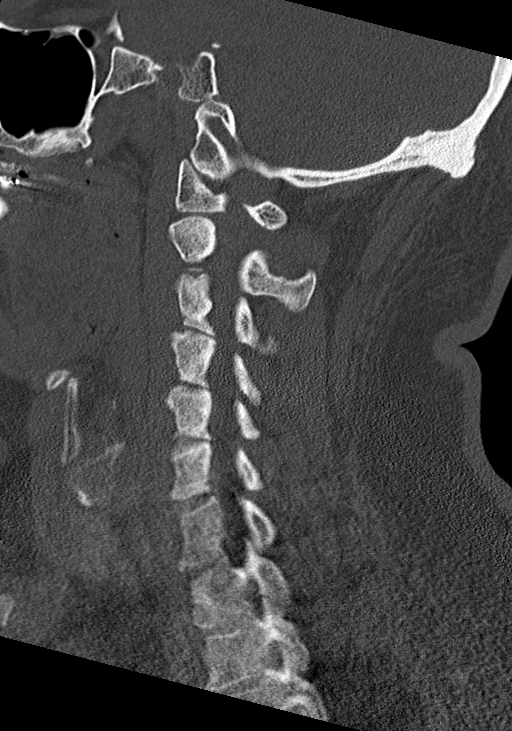
[im 76/114  bone]
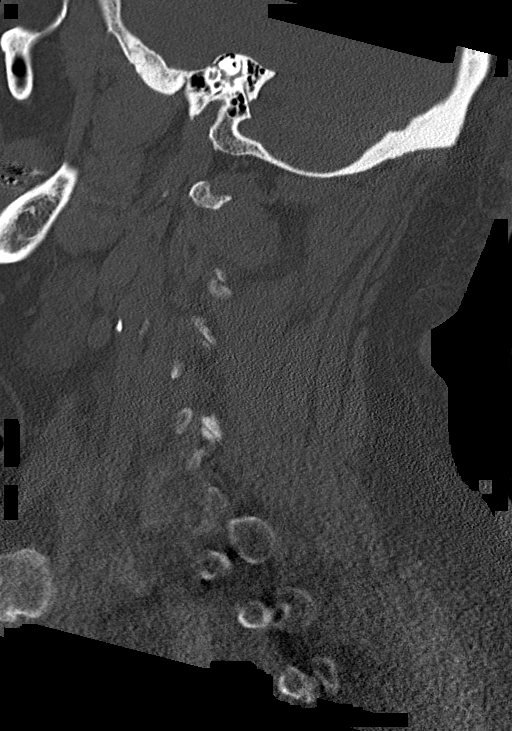

[Series 3: coronal bone · coronal · 0.44mm/px · 3 of 91 slices shown]
[im 19/91  bone]
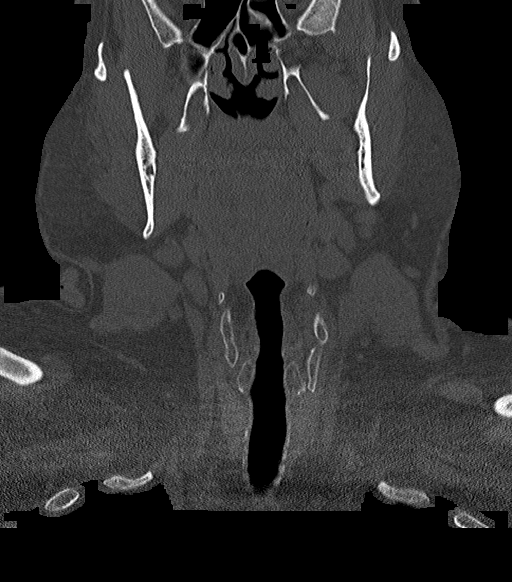
[im 37/91  bone]
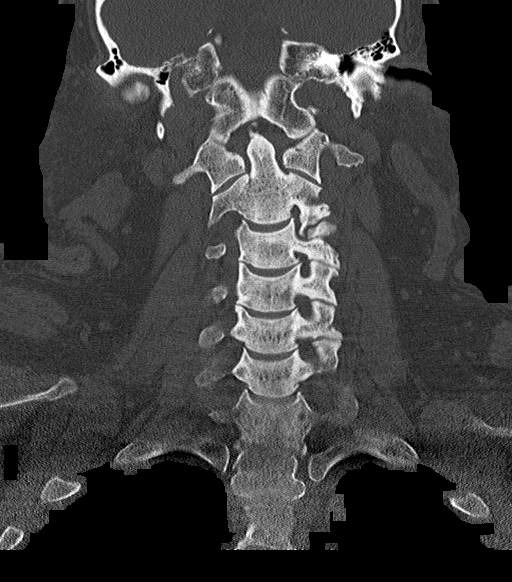
[im 55/91  bone]
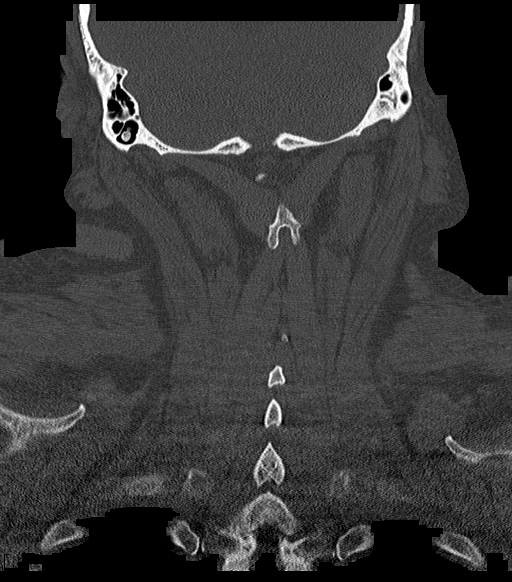

[Series 4: orthogonal bone · axial · 0.35mm/px · z∈[+891,+1055]mm · 4 of 129 slices shown, 5 images]
[im 22/129  soft-tissue]
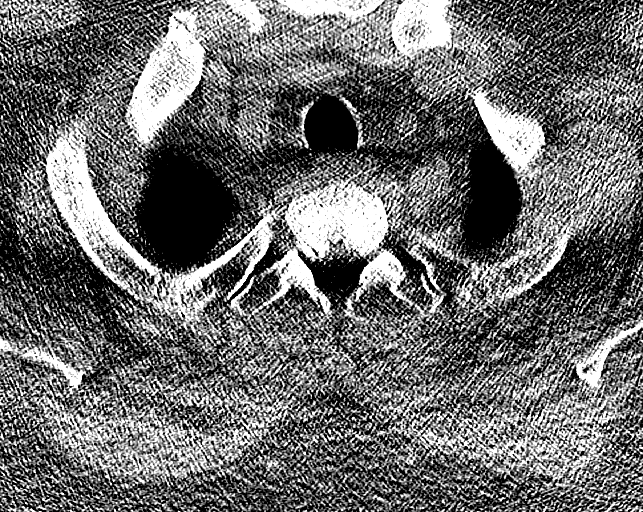
[im 22/129  bone]
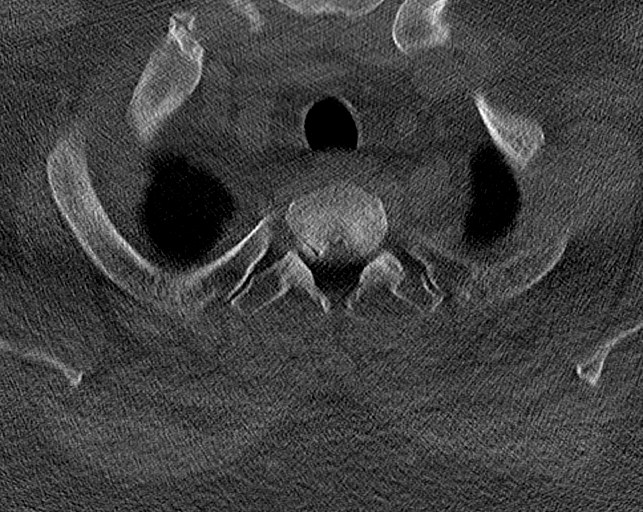
[im 43/129  bone]
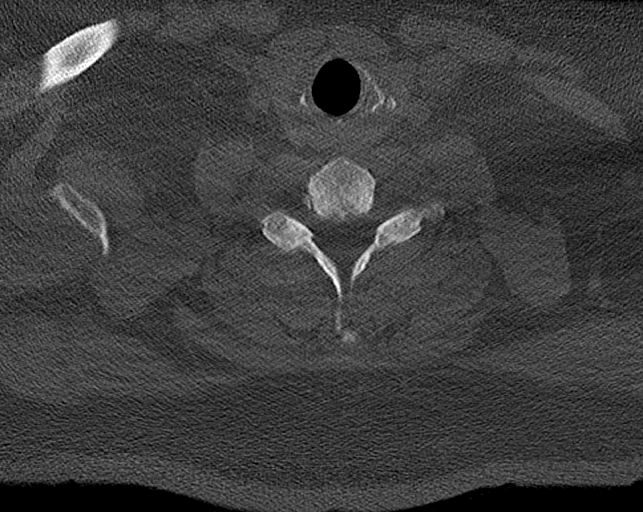
[im 86/129  bone]
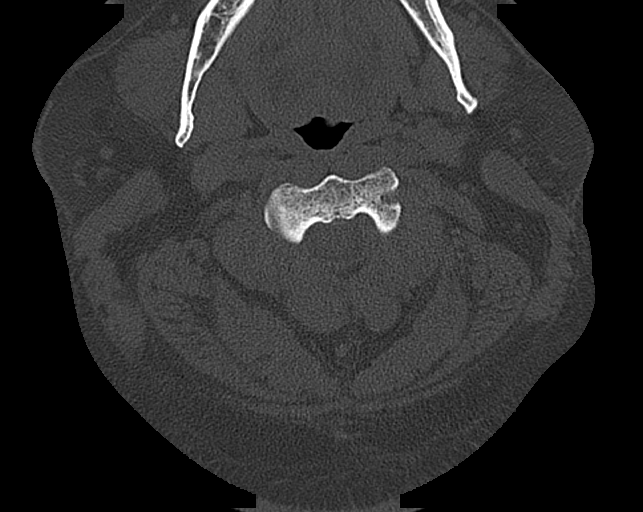
[im 107/129  bone]
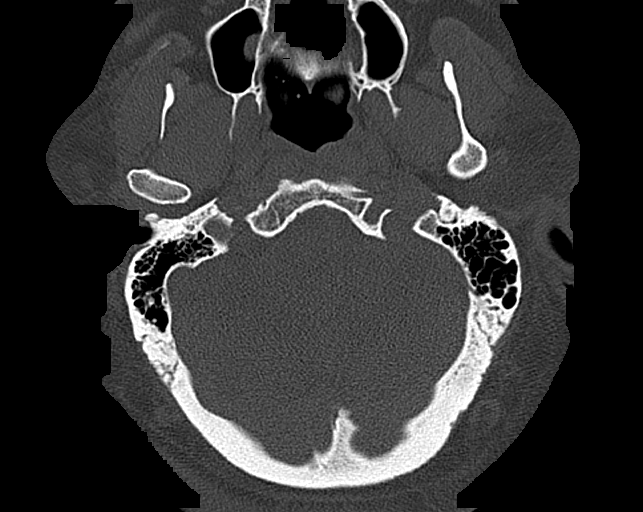

[12 of 33 positions shown; findings below may reference images not displayed]

FINDINGS: Alignment: Normal.

Skull base and vertebrae: No acute fracture. No primary bone lesion
or focal pathologic process.

Soft tissues and spinal canal: No prevertebral fluid or swelling. No
visible canal hematoma.

Disc levels: Intervertebral disc spaces are preserved. A prominent
osteophyte is noted on the left at C2-C3, with narrowing of the
associated bony foramen. Remaining visualized bony foramina are
preserved.

Upper chest: The visualized lung apices are clear. Mild
calcification is noted at the carotid bifurcations bilaterally. The
thyroid gland is unremarkable.

Other: No additional soft tissue abnormalities are seen.
IMPRESSION: 1. No evidence of fracture or subluxation along the cervical spine.
2. Prominent osteophyte on the left at C2-C3, with narrowing of the
associated bony foramen.
3. Mild calcification at the carotid bifurcations bilaterally.
Carotid ultrasound could be considered for further evaluation, as
deemed clinically appropriate.

## 2022-06-16 DIAGNOSIS — M2062 Acquired deformities of toe(s), unspecified, left foot: Secondary | ICD-10-CM | POA: Insufficient documentation

## 2022-10-04 ENCOUNTER — Encounter (HOSPITAL_BASED_OUTPATIENT_CLINIC_OR_DEPARTMENT_OTHER): Payer: Self-pay | Admitting: Emergency Medicine

## 2022-10-04 ENCOUNTER — Emergency Department (HOSPITAL_BASED_OUTPATIENT_CLINIC_OR_DEPARTMENT_OTHER)
Admission: EM | Admit: 2022-10-04 | Discharge: 2022-10-04 | Disposition: A | Discharge status: Home / Self Care | Payer: 59 | Attending: Emergency Medicine | Admitting: Emergency Medicine

## 2022-10-04 ENCOUNTER — Other Ambulatory Visit: Payer: Self-pay

## 2022-10-04 ENCOUNTER — Emergency Department (HOSPITAL_BASED_OUTPATIENT_CLINIC_OR_DEPARTMENT_OTHER): Payer: 59

## 2022-10-04 DIAGNOSIS — R1084 Generalized abdominal pain: Secondary | ICD-10-CM | POA: Diagnosis not present

## 2022-10-04 DIAGNOSIS — R109 Unspecified abdominal pain: Secondary | ICD-10-CM | POA: Diagnosis present

## 2022-10-04 DIAGNOSIS — R110 Nausea: Secondary | ICD-10-CM | POA: Insufficient documentation

## 2022-10-04 DIAGNOSIS — Z7984 Long term (current) use of oral hypoglycemic drugs: Secondary | ICD-10-CM | POA: Diagnosis not present

## 2022-10-04 DIAGNOSIS — Z91010 Allergy to peanuts: Secondary | ICD-10-CM | POA: Diagnosis not present

## 2022-10-04 DIAGNOSIS — I10 Essential (primary) hypertension: Secondary | ICD-10-CM | POA: Diagnosis not present

## 2022-10-04 DIAGNOSIS — Z79899 Other long term (current) drug therapy: Secondary | ICD-10-CM | POA: Insufficient documentation

## 2022-10-04 DIAGNOSIS — E119 Type 2 diabetes mellitus without complications: Secondary | ICD-10-CM | POA: Insufficient documentation

## 2022-10-04 DIAGNOSIS — R197 Diarrhea, unspecified: Secondary | ICD-10-CM

## 2022-10-04 LAB — COMPREHENSIVE METABOLIC PANEL
ALT: 23 U/L (ref 0–44)
AST: 24 U/L (ref 15–41)
Albumin: 4.2 g/dL (ref 3.5–5.0)
Alkaline Phosphatase: 44 U/L (ref 38–126)
Anion gap: 8 (ref 5–15)
BUN: 15 mg/dL (ref 6–20)
CO2: 23 mmol/L (ref 22–32)
Calcium: 9 mg/dL (ref 8.9–10.3)
Chloride: 104 mmol/L (ref 98–111)
Creatinine, Ser: 0.92 mg/dL (ref 0.61–1.24)
GFR, Estimated: >60 mL/min (ref >60)
Glucose, Bld: 93 mg/dL (ref 70–99)
Potassium: 3.7 mmol/L (ref 3.5–5.1)
Sodium: 135 mmol/L (ref 135–145)
Total Bilirubin: 0.8 mg/dL (ref 0.3–1.2)
Total Protein: 8.4 g/dL — ABNORMAL HIGH (ref 6.5–8.1)

## 2022-10-04 LAB — URINALYSIS, ROUTINE W REFLEX MICROSCOPIC
Bilirubin Urine: NEGATIVE
Glucose, UA: NEGATIVE mg/dL
Hgb urine dipstick: NEGATIVE
Ketones, ur: NEGATIVE mg/dL
Leukocytes,Ua: NEGATIVE
Nitrite: NEGATIVE
Protein, ur: NEGATIVE mg/dL
Specific Gravity, Urine: 1.025 (ref 1.005–1.030)
pH: 5.5 (ref 5.0–8.0)

## 2022-10-04 LAB — LIPASE, BLOOD: Lipase: 31 U/L (ref 11–51)

## 2022-10-04 LAB — CBC WITH DIFFERENTIAL/PLATELET
Abs Immature Granulocytes: 0.04 10*3/uL (ref 0.00–0.07)
Basophils Absolute: 0.1 10*3/uL (ref 0.0–0.1)
Basophils Relative: 0 %
Eosinophils Absolute: 0.1 10*3/uL (ref 0.0–0.5)
Eosinophils Relative: 1 %
HCT: 48.6 % (ref 39.0–52.0)
Hemoglobin: 16.7 g/dL (ref 13.0–17.0)
Immature Granulocytes: 0 %
Lymphocytes Relative: 19 %
Lymphs Abs: 2.4 10*3/uL (ref 0.7–4.0)
MCH: 28.7 pg (ref 26.0–34.0)
MCHC: 34.4 g/dL (ref 30.0–36.0)
MCV: 83.6 fL (ref 80.0–100.0)
Monocytes Absolute: 0.8 10*3/uL (ref 0.1–1.0)
Monocytes Relative: 6 %
Neutro Abs: 9.1 10*3/uL — ABNORMAL HIGH (ref 1.7–7.7)
Neutrophils Relative %: 74 %
Platelets: 325 10*3/uL (ref 150–400)
RBC: 5.81 MIL/uL (ref 4.22–5.81)
RDW: 12.7 % (ref 11.5–15.5)
WBC: 12.5 10*3/uL — ABNORMAL HIGH (ref 4.0–10.5)
nRBC: 0 % (ref 0.0–0.2)

## 2022-10-04 MED ORDER — IOHEXOL 300 MG/ML  SOLN
100.0000 mL | Freq: Once | INTRAMUSCULAR | Status: AC | PRN
  Administered 2022-10-04: 100 mL via INTRAVENOUS

## 2022-10-04 MED ORDER — MORPHINE SULFATE (PF) 4 MG/ML IV SOLN
4.0000 mg | Freq: Once | INTRAVENOUS | Status: AC
  Administered 2022-10-04: 4 mg via INTRAVENOUS
  Filled 2022-10-04: qty 1

## 2022-10-04 MED ORDER — ONDANSETRON HCL 4 MG/2ML IJ SOLN
4.0000 mg | Freq: Once | INTRAMUSCULAR | Status: AC
  Administered 2022-10-04: 4 mg via INTRAVENOUS
  Filled 2022-10-04: qty 2

## 2022-10-04 MED ORDER — DICYCLOMINE HCL 10 MG PO CAPS
10.0000 mg | ORAL_CAPSULE | Freq: Once | ORAL | Status: AC
  Administered 2022-10-04: 10 mg via ORAL
  Filled 2022-10-04: qty 1

## 2022-10-04 MED ORDER — DICYCLOMINE HCL 20 MG PO TABS: 20.0000 mg | ORAL_TABLET | Freq: Two times a day (BID) | ORAL | 0 refills | Status: DC

## 2022-10-04 MED ORDER — ONDANSETRON 4 MG PO TBDP: 4.0000 mg | ORAL_TABLET | Freq: Three times a day (TID) | ORAL | 0 refills | Status: DC | PRN

## 2022-10-04 NOTE — Discharge Instructions (Signed)
Pleasure taking care of you here in the emergency department today.  Your labs and imaging are reassuring.  I written you a few medications to help with your symptoms.  If they worsen, you develop fever, persistent vomiting, blood in your stool please seek reevaluation.

## 2022-10-04 NOTE — ED Triage Notes (Signed)
Nausea, diarrhea, and constant lower abdominal cramps since last night. Pain worsens before having a BM. Unable to tolerate PO intake. Sent here by PCP.

## 2022-10-04 NOTE — ED Provider Notes (Signed)
[facility] EMERGENCY DEPARTMENT Provider Note   CSN: [id number] Arrival date & time: 10/04/22  1811    History  Chief Complaint  Patient presents with   Abdominal Pain   Diarrhea    Terry Barnett is a 56 y.o. male history of hypertension, hypercholesterolemia, diabetes, GERD here for evaluation of abdominal pain, nausea and diarrhea.  Symptoms started yesterday.  Diffuse lower abdominal cramping.  Has had prior colonoscopy without any significant findings per patient.  No history of diverticulitis.  Still has appendix.  No dysuria hematuria.  He has had nausea without vomiting.  Pain currently rated an 8/10.  No recent antibiotics or travel.  Granddaughter did have a stomach bug last week however her symptoms had resolved prior to symptoms starting.  He has no back pain, numbness or weakness.  No melena or blood per rectum.  Prior open abd surgery for perf pancreatic duct and cystic duct after lap chole. Prior umbilical hernia repair  HPI     Home Medications Prior to Admission medications   Medication Sig Start Date End Date Taking? Authorizing Provider  dicyclomine (BENTYL) 20 MG tablet Take 1 tablet (20 mg total) by mouth 2 (two) times daily. 10/04/22  Yes Glen Blatchley A, PA-C  ondansetron (ZOFRAN-ODT) 4 MG disintegrating tablet Take 1 tablet (4 mg total) by mouth every 8 (eight) hours as needed for nausea or vomiting. 10/04/22  Yes Endrit Gittins A, PA-C  atorvastatin (LIPITOR) 80 MG tablet Take 80 mg by mouth daily.    [provider]  EPINEPHrine (EPIPEN 2-PAK) 0.3 mg/0.3 mL IJ SOAJ injection Inject 0.3 mg into the muscle once.    [provider]  esomeprazole (NEXIUM) 40 MG capsule Take 40 mg by mouth at bedtime.    [provider]  HYDROcodone-acetaminophen (NORCO) 5-325 MG tablet Take 1 tablet by mouth every 6 (six) hours as needed for severe pain. 07/12/18   Molpus, John, MD  levalbuterol [facility] HFA) 45 MCG/ACT inhaler Inhale 2  puffs into the lungs every 4 (four) hours as needed for wheezing.    [provider]  lisinopril-hydrochlorothiazide (PRINZIDE,ZESTORETIC) 20-12.5 MG tablet Take 1 tablet by mouth daily.    [provider]  metFORMIN (GLUCOPHAGE) 500 MG tablet Take 1,000 mg by mouth 2 (two) times daily with a meal.    [provider]  naproxen (NAPROSYN) 375 MG tablet Take 1 tablet twice daily as needed for chest pain. 07/12/18   Molpus, Jonny Ruiz, MD      Allergies    Peanuts [peanut oil], Peanut-containing drug products, and Sitagliptin-metformin hcl    Review of Systems   Review of Systems  Constitutional: Negative.   HENT: Negative.    Respiratory: Negative.    Cardiovascular: Negative.   Gastrointestinal:  Positive for abdominal pain, diarrhea and nausea. Negative for abdominal distention, anal bleeding, blood in stool, constipation, rectal pain and vomiting.  Genitourinary: Negative.   Musculoskeletal: Negative.   Neurological: Negative.   All other systems reviewed and are negative.  Physical Exam Updated Vital Signs BP (!) 156/94 (BP Location: Left Arm)   Pulse 87   Temp 98.4 F (36.9 C) (Oral)   Resp 20   Ht 5\' 10"  (1.778 m)   Wt 122.5 kg   SpO2 100%   BMI 38.74 kg/m  Physical Exam Vitals and nursing note reviewed.  Constitutional:      General: He is not in acute distress.    Appearance: He is well-developed. He is not ill-appearing, toxic-appearing  or diaphoretic.  HENT:     Head: Normocephalic and atraumatic.  Eyes:     Pupils: Pupils are equal, round, and reactive to light.  Cardiovascular:     Rate and Rhythm: Normal rate and regular rhythm.     Heart sounds: Normal heart sounds.  Pulmonary:     Effort: Pulmonary effort is normal. No respiratory distress.     Breath sounds: Normal breath sounds.  Abdominal:     General: Bowel sounds are normal. There is no distension.     Palpations: Abdomen is soft.     Tenderness: There is abdominal tenderness in  the right lower quadrant, suprapubic area and left lower quadrant. There is no right CVA tenderness, left CVA tenderness, guarding or rebound. Negative signs include Murphy's sign and McBurney's sign.  Musculoskeletal:        General: Normal range of motion.     Cervical back: Normal range of motion and neck supple.  Skin:    General: Skin is warm and dry.  Neurological:     General: No focal deficit present.     Mental Status: He is alert and oriented to person, place, and time.    ED Results / Procedures / Treatments   Labs (all labs ordered are listed, but only abnormal results are displayed) Labs Reviewed  COMPREHENSIVE METABOLIC PANEL - Abnormal; Notable for the following components:      Result Value   Total Protein 8.4 (*)    All other components within normal limits  CBC WITH DIFFERENTIAL/PLATELET - Abnormal; Notable for the following components:   WBC 12.5 (*)    Neutro Abs 9.1 (*)    All other components within normal limits  LIPASE, BLOOD  URINALYSIS, ROUTINE W REFLEX MICROSCOPIC    EKG None  Radiology CT ABDOMEN PELVIS W CONTRAST  Result Date: 10/04/2022 CLINICAL DATA:  Nausea, diarrhea, and constant lower abdominal cramps. EXAM: CT ABDOMEN AND PELVIS WITH CONTRAST TECHNIQUE: Multidetector CT imaging of the abdomen and pelvis was performed using the standard protocol following bolus administration of intravenous contrast. RADIATION DOSE REDUCTION: This exam was performed according to the departmental dose-optimization program which includes automated exposure control, adjustment of the mA and/or kV according to patient size and/or use of iterative reconstruction technique. CONTRAST:  OMNIPAQUE IOHEXOL 300 MG/ML  SOLN COMPARISON:  None Available. FINDINGS: Lower chest: Scattered coronary artery calcifications are noted. Hepatobiliary: No focal liver abnormality is seen. Status post cholecystectomy. No biliary dilatation. Pancreas: Unremarkable. No pancreatic ductal  dilatation or surrounding inflammatory changes. Spleen: Normal in size without focal abnormality. Adrenals/Urinary Tract: The adrenal glands are normal. The kidneys enhance symmetrically. No renal calculus or hydronephrosis. The bladder is unremarkable. Stomach/Bowel: Stomach is within normal limits. Appendix appears normal. No evidence of bowel wall thickening, distention, or inflammatory changes. No free air or pneumatosis. Vascular/Lymphatic: Aortic atherosclerosis. No enlarged abdominal or pelvic lymph nodes. Reproductive: Prostate is unremarkable. Other: No abdominopelvic ascites. Fat containing inguinal hernias are present bilaterally. Musculoskeletal: Degenerative changes are present in the thoracolumbar spine. No acute osseous abnormality. IMPRESSION: 1. No acute intra-abdominal process. 2. Coronary artery calcifications. 3. Aortic atherosclerosis. Electronically Signed   By: Thornell Sartorius M.D.   On: 10/04/2022 20:08    Procedures Procedures    Medications Ordered in ED Medications  ondansetron (ZOFRAN) injection 4 mg (4 mg Intravenous Given 10/04/22 1943)  morphine (PF) 4 MG/ML injection 4 mg (4 mg Intravenous Given 10/04/22 1943)  iohexol (OMNIPAQUE) 300 MG/ML solution 100 mL (100 mLs  Intravenous Contrast Given 10/04/22 1950)  dicyclomine (BENTYL) capsule 10 mg (10 mg Oral Given 10/04/22 2024)    ED Course/ Medical Decision Making/ A&P    56 year old here for evaluation of abdominal pain and diarrhea.  Symptoms began yesterday.  States he has severe lower abdominal cramping.  Does not radiate.  No recent antibiotics or travel.  No melena bright per rectum.  Some nausea no vomiting.  Plan on labs, imaging and reassess  Labs and imaging personally viewed and interpreted:  CBC without leukocytosis CMP without significant abnormality UA neg for infection Lipase 31 CT AP without acute abnormality  Patient reassessed after IVF, pain meds and antiemetics.  Pain significantly improved.  He  is tolerating p.o. intake.  Repeat benign abdominal exam.  Suspect possible gastroenteritis as cause of his symptoms.  He has no symptoms to suggest infectious etiology of diarrhea.  CT scan reassuring without evidence of appendicitis, diverticulitis, abscess, colitis, perforation.  Will treat symptomatically and have close follow-up with PCP.  He will return for new or worsening symptoms.  The patient has been appropriately medically screened and/or stabilized in the ED. I have low suspicion for any other emergent medical condition which would require further screening, evaluation or treatment in the ED or require inpatient management.  Patient is hemodynamically stable and in no acute distress.  Patient able to ambulate in department prior to ED.  Evaluation does not show acute pathology that would require ongoing or additional emergent interventions while in the emergency department or further inpatient treatment.  I have discussed the diagnosis with the patient and answered all questions.  Pain is been managed while in the emergency department and patient has no further complaints prior to discharge.  Patient is comfortable with plan discussed in room and is stable for discharge at this time.  I have discussed strict return precautions for returning to the emergency department.  Patient was encouraged to follow-up with PCP/specialist refer to at discharge.                            Medical Decision Making Amount and/or Complexity of Data Reviewed Independent Historian: parent External Data Reviewed: notes. Labs: ordered. Decision-making details documented in ED Course. Radiology: ordered and independent interpretation performed. Decision-making details documented in ED Course.  Risk OTC drugs. Prescription drug management. Parenteral controlled substances. Decision regarding hospitalization. Diagnosis or treatment significantly limited by social determinants of  health.          Final Clinical Impression(s) / ED Diagnoses Final diagnoses:  Diarrhea, unspecified type  Generalized abdominal pain    Rx / DC Orders ED Discharge Orders          Ordered    dicyclomine (BENTYL) 20 MG tablet  2 times daily        10/04/22 2113    ondansetron (ZOFRAN-ODT) 4 MG disintegrating tablet  Every 8 hours PRN        10/04/22 2113              Froylan Hobby A, PA-C 16/10/96 0454    Lianne Cure, DO 09/81/19 2224

## 2022-11-01 DIAGNOSIS — J4520 Mild intermittent asthma, uncomplicated: Secondary | ICD-10-CM | POA: Insufficient documentation

## 2023-02-03 ENCOUNTER — Emergency Department (HOSPITAL_BASED_OUTPATIENT_CLINIC_OR_DEPARTMENT_OTHER)
Admission: EM | Admit: 2023-02-03 | Discharge: 2023-02-03 | Disposition: A | Discharge status: Home / Self Care | Payer: 59 | Attending: Emergency Medicine | Admitting: Emergency Medicine

## 2023-02-03 ENCOUNTER — Encounter (HOSPITAL_BASED_OUTPATIENT_CLINIC_OR_DEPARTMENT_OTHER): Payer: Self-pay

## 2023-02-03 ENCOUNTER — Other Ambulatory Visit: Payer: Self-pay

## 2023-02-03 ENCOUNTER — Emergency Department (HOSPITAL_BASED_OUTPATIENT_CLINIC_OR_DEPARTMENT_OTHER): Payer: 59

## 2023-02-03 DIAGNOSIS — M79604 Pain in right leg: Secondary | ICD-10-CM | POA: Insufficient documentation

## 2023-02-03 DIAGNOSIS — R202 Paresthesia of skin: Secondary | ICD-10-CM | POA: Diagnosis not present

## 2023-02-03 DIAGNOSIS — Z91010 Allergy to peanuts: Secondary | ICD-10-CM | POA: Insufficient documentation

## 2023-02-03 DIAGNOSIS — R200 Anesthesia of skin: Secondary | ICD-10-CM | POA: Diagnosis present

## 2023-02-03 LAB — CBC WITH DIFFERENTIAL/PLATELET
Abs Immature Granulocytes: 0.03 10*3/uL (ref 0.00–0.07)
Basophils Absolute: 0.1 10*3/uL (ref 0.0–0.1)
Basophils Relative: 1 %
Eosinophils Absolute: 0.1 10*3/uL (ref 0.0–0.5)
Eosinophils Relative: 2 %
HCT: 42.6 % (ref 39.0–52.0)
Hemoglobin: 14.8 g/dL (ref 13.0–17.0)
Immature Granulocytes: 0 %
Lymphocytes Relative: 36 %
Lymphs Abs: 2.4 10*3/uL (ref 0.7–4.0)
MCH: 29.4 pg (ref 26.0–34.0)
MCHC: 34.7 g/dL (ref 30.0–36.0)
MCV: 84.5 fL (ref 80.0–100.0)
Monocytes Absolute: 0.4 10*3/uL (ref 0.1–1.0)
Monocytes Relative: 6 %
Neutro Abs: 3.7 10*3/uL (ref 1.7–7.7)
Neutrophils Relative %: 55 %
Platelets: 307 10*3/uL (ref 150–400)
RBC: 5.04 MIL/uL (ref 4.22–5.81)
RDW: 12.4 % (ref 11.5–15.5)
WBC: 6.8 10*3/uL (ref 4.0–10.5)
nRBC: 0 % (ref 0.0–0.2)

## 2023-02-03 LAB — BASIC METABOLIC PANEL
Anion gap: 5 (ref 5–15)
BUN: 21 mg/dL — ABNORMAL HIGH (ref 6–20)
CO2: 22 mmol/L (ref 22–32)
Calcium: 8.2 mg/dL — ABNORMAL LOW (ref 8.9–10.3)
Chloride: 104 mmol/L (ref 98–111)
Creatinine, Ser: 1.16 mg/dL (ref 0.61–1.24)
GFR, Estimated: >60 mL/min (ref >60)
Glucose, Bld: 181 mg/dL — ABNORMAL HIGH (ref 70–99)
Potassium: 3.7 mmol/L (ref 3.5–5.1)
Sodium: 131 mmol/L — ABNORMAL LOW (ref 135–145)

## 2023-02-03 LAB — MAGNESIUM: Magnesium: 2 mg/dL (ref 1.7–2.4)

## 2023-02-03 LAB — CK: Total CK: 190 U/L (ref 49–397)

## 2023-02-03 MED ORDER — ACETAMINOPHEN 500 MG PO TABS
1000.0000 mg | ORAL_TABLET | Freq: Once | ORAL | Status: AC
  Administered 2023-02-03: 1000 mg via ORAL
  Filled 2023-02-03: qty 2

## 2023-02-03 MED ORDER — KETOROLAC TROMETHAMINE 15 MG/ML IJ SOLN
15.0000 mg | Freq: Once | INTRAMUSCULAR | Status: AC
  Administered 2023-02-03: 15 mg via INTRAMUSCULAR
  Filled 2023-02-03: qty 1

## 2023-02-03 MED ORDER — OXYCODONE HCL 5 MG PO TABS
5.0000 mg | ORAL_TABLET | Freq: Once | ORAL | Status: AC
  Administered 2023-02-03: 5 mg via ORAL
  Filled 2023-02-03: qty 1

## 2023-02-03 NOTE — ED Provider Notes (Signed)
[facility] Provider Note   CSN: [id number] Arrival date & time: 02/03/23  1242     History  Chief Complaint  Patient presents with   Leg Pain   Numbness    Terry Barnett is a 57 y.o. male.  57 yo M with a chief complaints of right leg pain.  He said it started at the top of his right foot and he noticed that hurt where his shoe was placed and when he ambulated and then suddenly about an hour and a half ago felt like his whole leg became tingly.  Feels like his leg is especially sensitive still over the aspect of the foot and right overlying the knee.  He denies trauma denies back pain denies loss of bowel or bladder denies loss of rectal sensation.  He does have pain with ambulation.   Leg Pain      Home Medications Prior to Admission medications   Medication Sig Start Date End Date Taking? Authorizing Provider  atorvastatin (LIPITOR) 80 MG tablet Take 80 mg by mouth daily.    [provider]  dicyclomine (BENTYL) 20 MG tablet Take 1 tablet (20 mg total) by mouth 2 (two) times daily. 10/04/22   Henderly, Britni A, PA-C  EPINEPHrine (EPIPEN 2-PAK) 0.3 mg/0.3 mL IJ SOAJ injection Inject 0.3 mg into the muscle once.    [provider]  esomeprazole (NEXIUM) 40 MG capsule Take 40 mg by mouth at bedtime.    [provider]  HYDROcodone-acetaminophen (NORCO) 5-325 MG tablet Take 1 tablet by mouth every 6 (six) hours as needed for severe pain. 07/12/18   Molpus, John, MD  levalbuterol [facility] HFA) 45 MCG/ACT inhaler Inhale 2 puffs into the lungs every 4 (four) hours as needed for wheezing.    [provider]  lisinopril-hydrochlorothiazide (PRINZIDE,ZESTORETIC) 20-12.5 MG tablet Take 1 tablet by mouth daily.    [provider]  metFORMIN (GLUCOPHAGE) 500 MG tablet Take 1,000 mg by mouth 2 (two) times daily with a meal.    [provider]  naproxen (NAPROSYN) 375 MG tablet Take 1  tablet twice daily as needed for chest pain. 07/12/18   Molpus, John, MD  ondansetron (ZOFRAN-ODT) 4 MG disintegrating tablet Take 1 tablet (4 mg total) by mouth every 8 (eight) hours as needed for nausea or vomiting. 10/04/22   Henderly, Britni A, PA-C      Allergies    Peanuts [peanut oil], Peanut-containing drug products, and Sitagliptin-metformin hcl    Review of Systems   Review of Systems  Physical Exam Updated Vital Signs BP 112/73 (BP Location: Left Arm)   Pulse 86   Temp 98.4 F (36.9 C) (Oral)   Resp 18   Ht 5\' 9"  (1.753 m)   Wt 122.5 kg   SpO2 98%   BMI 39.87 kg/m  Physical Exam Vitals and nursing note reviewed.  Constitutional:      Appearance: He is well-developed.  HENT:     Head: Normocephalic and atraumatic.  Eyes:     Pupils: Pupils are equal, round, and reactive to light.  Neck:     Vascular: No JVD.  Cardiovascular:     Rate and Rhythm: Normal rate and regular rhythm.     Heart sounds: No murmur heard.    No friction rub. No gallop.  Pulmonary:     Effort: No respiratory distress.     Breath sounds: No wheezing.  Abdominal:     General: There  is no distension.     Tenderness: There is no abdominal tenderness. There is no guarding or rebound.  Musculoskeletal:        General: Normal range of motion.     Cervical back: Normal range of motion and neck supple.     Comments: Pulse motor and sensation intact to the right lower extremity.  Reflexes 2+ and equal.  No clonus.  Skin:    Coloration: Skin is not pale.     Findings: No rash.  Neurological:     Mental Status: He is alert and oriented to person, place, and time.  Psychiatric:        Behavior: Behavior normal.     ED Results / Procedures / Treatments   Labs (all labs ordered are listed, but only abnormal results are displayed) Labs Reviewed  BASIC METABOLIC PANEL - Abnormal; Notable for the following components:      Result Value   Sodium 131 (*)    Glucose, Bld 181 (*)    BUN 21 (*)     Calcium 8.2 (*)    All other components within normal limits  CBC WITH DIFFERENTIAL/PLATELET  MAGNESIUM  CK    EKG None  Radiology DG Foot Complete Right  Result Date: 02/03/2023 CLINICAL DATA:  Foot pain. Right leg numbness. Patient reports foot injury 3 weeks ago. EXAM: RIGHT FOOT COMPLETE - 3+ VIEW COMPARISON:  None Available. FINDINGS: No acute or healing fracture. No dislocation. Normal alignment. There is a large plantar calcaneal spur and moderate Achilles tendon enthesophyte. Mild degenerative change of the first metatarsal phalangeal joint. No periostitis or erosions. Incidental os navicular and os peroneal. Vascular calcifications are seen. IMPRESSION: 1. No acute or healing fracture of the right foot. 2. 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 3. Large plantar calcaneal spur and moderate Achilles tendon enthesophyte. Electronically Signed   By: Keith Rake M.D.   On: 02/03/2023 13:43   DG Knee Complete 4 Views Right  Result Date: 02/03/2023 CLINICAL DATA:  Foot pain. Patient reports right leg numbness. EXAM: RIGHT KNEE - COMPLETE 4+ VIEW COMPARISON:  None Available. FINDINGS: No fracture or dislocation. The joint spaces are preserved. There is trace patellofemoral spurring. Sherald Barge is noted. Minimal knee joint effusion. No focal bone lesions or erosive change. IMPRESSION: Trace patellofemoral spurring with minimal knee joint effusion. No acute findings. Electronically Signed   By: Keith Rake M.D.   On: 02/03/2023 13:41    Procedures Procedures    Medications Ordered in ED Medications  acetaminophen (TYLENOL) tablet 1,000 mg (1,000 mg Oral Given 02/03/23 1318)  oxyCODONE (Oxy IR/ROXICODONE) immediate release tablet 5 mg (5 mg Oral Given 02/03/23 1317)  ketorolac (TORADOL) 15 MG/ML injection 15 mg (15 mg Intramuscular Given 02/03/23 1317)    ED Course/ Medical Decision Making/ A&P                             Medical Decision Making Amount and/or  Complexity of Data Reviewed Labs: ordered. Radiology: ordered.  Risk OTC drugs. Prescription drug management.   57 yo M with a chief complaint of right leg pain.  This was somewhat sudden in onset.  He was reported to have some weakness by the triage nurse.  No obvious weakness on my exam.  Has intact pulse and sensation as well.  I am not sure what would be causing his discomfort acutely.  Will obtain basic blood work to assess for  electrolyte abnormality, rhabdomyolysis.  Treat pain.  Reassess.  Patient CK is normal, no significant electrolyte abnormality.  No anemia no leukocytosis.  Plain film of the knee and the foot independently interpreted by me without obvious soft tissue gas, no fracture no dislocation.  Will have the patient be nonweightbearing to the right lower extremity.  Have him follow-up with his family doctor in the office.  1:50 PM:  I have discussed the diagnosis/risks/treatment options with the patient and family.  Evaluation and diagnostic testing in the emergency department does not suggest an emergent condition requiring admission or immediate intervention beyond what has been performed at this time.  They will follow up with PCP. We also discussed returning to the ED immediately if new or worsening sx occur. We discussed the sx which are most concerning (e.g., sudden worsening pain, fever, inability to tolerate by mouth) that necessitate immediate return. Medications administered to the patient during their visit and any new prescriptions provided to the patient are listed below.  Medications given during this visit Medications  acetaminophen (TYLENOL) tablet 1,000 mg (1,000 mg Oral Given 02/03/23 1318)  oxyCODONE (Oxy IR/ROXICODONE) immediate release tablet 5 mg (5 mg Oral Given 02/03/23 1317)  ketorolac (TORADOL) 15 MG/ML injection 15 mg (15 mg Intramuscular Given 02/03/23 1317)     The patient appears reasonably screen and/or stabilized for discharge and I doubt any  other medical condition or other [facility] requiring further screening, evaluation, or treatment in the ED at this time prior to discharge.          Final Clinical Impression(s) / ED Diagnoses Final diagnoses:  Paresthesia    Rx / DC Orders ED Discharge Orders          Ordered    Ambulatory referral to Neurology       Comments: Acute paresthesia?   02/03/23 [place], DO 02/03/23 1350

## 2023-02-03 NOTE — ED Triage Notes (Signed)
States has had pain in right foot since this morning, started having numbness to right leg at 1115. Had injury to right foot 3 weeks ago. Denies numbness/weakness to other extremities. Pulses strong, weakness noted to leg.  Hx of diabetes.

## 2023-02-03 NOTE — Discharge Instructions (Addendum)
Take 4 over the counter ibuprofen tablets 3 times a day or 2 over-the-counter naproxen tablets twice a day for pain. Also take tylenol 1000mg (2 extra strength) four times a day.   I am not sure what is causing your symptoms.  Please return for any worsening.  Please follow-up with your family doctor in the office.  I have provided you crutches to try and keep your weight off of this for at least the next week.

## 2023-02-03 NOTE — ED Notes (Signed)
Patient transported to X-ray 

## 2023-02-03 NOTE — ED Notes (Signed)
Dr. Tyrone Nine notified of patient.

## 2023-02-03 NOTE — ED Notes (Signed)
ED Provider at bedside. 

## 2023-02-08 ENCOUNTER — Other Ambulatory Visit: Payer: Self-pay

## 2023-02-08 ENCOUNTER — Other Ambulatory Visit: Payer: Self-pay | Admitting: Pharmacy Technician

## 2023-02-08 NOTE — Progress Notes (Signed)
Received referral for patient from Thedora Hinders, [place].  Patient looking for assistance with Ozempic 9m and Lantus Solostar.  Reached out to patient.  Patient stated that someone at the provider's office is going to help him with patient assistance.  Patient to contact me if that does not happen.  BJacquelynn CreePatient Advocate Specialist [place]

## 2023-06-14 ENCOUNTER — Other Ambulatory Visit (HOSPITAL_BASED_OUTPATIENT_CLINIC_OR_DEPARTMENT_OTHER): Payer: Self-pay | Admitting: Registered Nurse

## 2023-06-14 DIAGNOSIS — Z8249 Family history of ischemic heart disease and other diseases of the circulatory system: Secondary | ICD-10-CM

## 2023-06-15 DIAGNOSIS — S83209A Unspecified tear of unspecified meniscus, current injury, unspecified knee, initial encounter: Secondary | ICD-10-CM | POA: Insufficient documentation

## 2023-06-15 DIAGNOSIS — M25562 Pain in left knee: Secondary | ICD-10-CM | POA: Insufficient documentation

## 2023-06-20 ENCOUNTER — Ambulatory Visit (HOSPITAL_BASED_OUTPATIENT_CLINIC_OR_DEPARTMENT_OTHER)
Admission: RE | Admit: 2023-06-20 | Discharge: 2023-06-20 | Disposition: A | Discharge status: Home / Self Care | Payer: 59 | Source: Ambulatory Visit | Attending: Registered Nurse | Admitting: Registered Nurse

## 2023-06-20 DIAGNOSIS — Z8249 Family history of ischemic heart disease and other diseases of the circulatory system: Secondary | ICD-10-CM | POA: Insufficient documentation

## 2023-12-07 DIAGNOSIS — G5712 Meralgia paresthetica, left lower limb: Secondary | ICD-10-CM | POA: Insufficient documentation

## 2024-01-05 ENCOUNTER — Encounter: Payer: Self-pay | Admitting: Internal Medicine

## 2024-01-05 ENCOUNTER — Ambulatory Visit: Payer: 59 | Attending: Internal Medicine | Admitting: Internal Medicine

## 2024-01-05 VITALS — BP 118/72 | HR 80 | Ht 70.0 in | Wt 252.6 lb

## 2024-01-05 DIAGNOSIS — R931 Abnormal findings on diagnostic imaging of heart and coronary circulation: Secondary | ICD-10-CM | POA: Diagnosis not present

## 2024-01-05 DIAGNOSIS — E66812 Obesity, class 2: Secondary | ICD-10-CM

## 2024-01-05 DIAGNOSIS — I2510 Atherosclerotic heart disease of native coronary artery without angina pectoris: Secondary | ICD-10-CM

## 2024-01-05 DIAGNOSIS — I10 Essential (primary) hypertension: Secondary | ICD-10-CM | POA: Diagnosis not present

## 2024-01-05 DIAGNOSIS — E7800 Pure hypercholesterolemia, unspecified: Secondary | ICD-10-CM

## 2024-01-05 DIAGNOSIS — R011 Cardiac murmur, unspecified: Secondary | ICD-10-CM

## 2024-01-05 DIAGNOSIS — Z6836 Body mass index (BMI) 36.0-36.9, adult: Secondary | ICD-10-CM

## 2024-01-05 NOTE — Progress Notes (Signed)
 Cardiology Office Note:  .   Date:  01/05/2024  ID:  Terry Barnett, DOB 1966/08/11, MRN [id number] PCP: Terry Ronal Czar, FNP  [facility] Providers Cardiologist:  Terry DELENA Merck, MD    History of Present Illness: .   Terry Barnett is a 58 y.o. male.  Discussed the use of AI scribe software for clinical note transcription with the patient, who gave verbal consent to proceed.  History of Present Illness   The patient, a social research officer, government at [employer], was referred to cardiology for coronary artery calcifications with aortic valve calcifications. The patient has a history of diabetes, hypertension, and hyperlipidemia. The patient's father and both grandfathers died from heart disease, which raises concern for a genetic predisposition. The patient has been managing diabetes with Ozempic, Lantus, and Farxiga, and has lost significant weight. The patient also takes losartan for hypertension and atorvastatin and fenofibrate for hyperlipidemia. The patient's cholesterol levels have improved significantly, with LDL at 59, triglycerides at 225, and HDL at 34. The patient is asymptomatic but has a high coronary calcium score of 1302, indicating a risk of obstructive coronary artery disease. The patient also has aortic valve calcification, which will need to be monitored.        ROS: negative except per HPI above.  Studies Reviewed: SABRA   EKG Interpretation Date/Time:  Thursday January 05 2024 15:58:02 EST Ventricular Rate:  80 PR Interval:  210 QRS Duration:  80 QT Interval:  394 QTC Calculation: 454 R Axis:   42  Text Interpretation: Sinus rhythm with 1st degree A-V block Nonspecific T wave abnormality Confirmed by Barnett Terry ([id number]) on 01/05/2024 4:15:16 PM    Results   LABS LDL: 59 (01/2023) Triglycerides: 225 (01/2023) HDL: 34 (01/2023) Hemoglobin: 15 (01/2023) Hematocrit: 43 (01/2023) Platelet count: 294 (01/2023)  RADIOLOGY Calcium scoring CT: Calcium score  1302, 99th percentile, aortic valve calcification score 664, moderate (05/2023)  DIAGNOSTIC EKG: Normal sinus rhythm, first-degree atrioventricular block     Risk Assessment/Calculations:             Physical Exam:   VS:  BP 118/72 (BP Location: Right Arm, Patient Position: Sitting, Cuff Size: Normal)   Pulse 80   Ht 5' 10 (1.778 m)   Wt 252 lb 9.6 oz (114.6 kg)   SpO2 92%   BMI 36.24 kg/m    Wt Readings from Last 3 Encounters:  01/05/24 252 lb 9.6 oz (114.6 kg)  02/03/23 270 lb (122.5 kg)  10/04/22 270 lb (122.5 kg)     Physical Exam   CARDIOVASCULAR: Faint 1/6 systolic murmur early peaking.     GEN: Well nourished, well developed in no acute distress NECK: No JVD; No carotid bruits CARDIAC: RRR,  RESPIRATORY:  Clear to auscultation without rales, wheezing or rhonchi  ABDOMEN: Soft, non-tender, non-distended EXTREMITIES:  No edema; No deformity   ASSESSMENT AND PLAN: .    Assessment & Plan Coronary artery calcification  Murmur  Elevated coronary artery calcium score  Primary hypertension  Hypercholesteremia  Class 2 obesity without serious comorbidity with body mass index (BMI) of 36.0 to 36.9 in adult, unspecified obesity type   Assessment and Plan    Coronary Artery Calcification Calcium score of 1302 (99th percentile) with calcification in all three main arteries. No current symptoms of chest pain or shortness of breath. Family history of heart disease. -Order PET CT stress test to assess blood flow and potential blockages. -Refer to lipid clinic for discussion of PCSK9 inhibitors  Aortic Valve Calcification Moderate calcification score of 664 on aortic valve. No current symptoms. -Order echocardiogram to evaluate for AS.  Type 2 Diabetes Currently managed with Ozempic 1mg  weekly, Lantus 10 units daily, and Farxiga 10mg  daily. Recent weight loss and improved blood sugar control. -Continue current medication regimen.  Hypertension Managed with  Losartan 50mg  daily. -Continue current medication regimen.  Hyperlipidemia Managed with Atorvastatin 80mg  daily and Fenofibrate 67mg  daily. Recent labs show improved cholesterol levels. -Continue current medication regimen. -Consider adjusting statin dosage if PCSK9 inhibitors are initiated.  Follow-up in 2 months after completion of tests.            Informed Consent   Shared Decision Making/Informed Consent The risks [chest pain, shortness of breath, cardiac arrhythmias, dizziness, blood pressure fluctuations, myocardial infarction, stroke/transient ischemic attack, nausea, vomiting, allergic reaction, radiation exposure, metallic taste sensation and life-threatening complications (estimated to be 1 in 10,000)], benefits (risk stratification, diagnosing coronary artery disease, treatment guidance) and alternatives of a cardiac PET stress test were discussed in detail with Mr. Terry Barnett and he agrees to proceed.      I spent 60 minutes in the care of Iyan Flett Barnett today including reviewing labs (2/24), reviewing outside labs from PCP (2/24), reviewing studies (6/24 cal score), face to face time discussing treatment options (35 min), and documenting in the encounter.

## 2024-01-05 NOTE — Patient Instructions (Addendum)
 Medication Instructions:  Your physician recommends that you continue on your current medications as directed. Please refer to the Current Medication list given to you today.  *If you need a refill on your cardiac medications before your next appointment, please call your pharmacy*  Lab Work: None  Testing/Procedures: Your physician has requested that you have an echocardiogram. Echocardiography is a painless test that uses sound waves to create images of your heart. It provides your doctor with information about the size and shape of your heart and how well your heart's chambers and valves are working. This procedure takes approximately one hour. There are no restrictions for this procedure. Please do NOT wear cologne, perfume, aftershave, or lotions (deodorant is allowed). Please arrive 15 minutes prior to your appointment time. This will take place at [address]     *For the PET Stress Test: Please report to Radiology at [facility] Main Entrance, medical mall, 30 mins prior to your test.  [address]  [place], [place]  [phone]  How to Prepare for Your Cardiac PET/CT Stress Test:  Nothing to eat or drink, except water, 3 hours prior to arrival time.  NO caffeine/decaffeinated products, or chocolate 12 hours prior to arrival. (Please note decaffeinated beverages (teas/coffees) still contain caffeine).  If you have caffeine within 12 hours prior, the test will need to be rescheduled.  Medication instructions: Do not take erectile dysfunction medications for 72 hours prior to test (sildenafil, tadalafil) Do not take nitrates (isosorbide mononitrate, Ranexa) the day before or day of test Do not take tamsulosin the day before or morning of test Hold theophylline containing medications for 12 hours. Hold Dipyridamole 48 hours prior to the test.  Diabetic Preparation: If able to eat breakfast prior to 3 hour fasting, you may take all  medications, including your insulin. Do not worry if you miss your breakfast dose of insulin - start at your next meal. If you do not eat prior to 3 hour fast-Hold all diabetes (oral and insulin) medications. Patients who wear a continuous glucose monitor MUST remove the device prior to scanning.  You may take your remaining medications with water.  NO perfume, cologne or lotion on chest or abdomen area.  Total time is 1 to 2 hours; you may want to bring reading material for the waiting time.  IF YOU THINK YOU MAY BE PREGNANT, OR ARE NURSING PLEASE INFORM THE TECHNOLOGIST.  In preparation for your appointment, medication and supplies will be purchased.  Appointment availability is limited, so if you need to cancel or reschedule, please call the Radiology Department at [phone] [address]) OR [phone] [facility]) 24 hours in advance to avoid a cancellation fee of $100.00  What to Expect When you Arrive:  Once you arrive and check in for your appointment, you will be taken to a preparation room within the Radiology Department.  A technologist or Nurse will obtain your medical history, verify that you are correctly prepped for the exam, and explain the procedure.  Afterwards, an IV will be started in your arm and electrodes will be placed on your skin for EKG monitoring during the stress portion of the exam. Then you will be escorted to the PET/CT scanner.  There, staff will get you positioned on the scanner and obtain a blood pressure and EKG.  During the exam, you will continue to be connected to the EKG and blood pressure machines.  A small, safe amount of a radioactive tracer  will be injected in your IV to obtain a series of pictures of your heart along with an injection of a stress agent.    After your Exam:  It is recommended that you eat a meal and drink a caffeinated beverage to counter act any effects of the stress agent.  Drink plenty of fluids for the remainder of the day and  urinate frequently for the first couple of hours after the exam.  Your doctor will inform you of your test results within 7-10 business days.  For more information and frequently asked questions, please visit our website: [email or web address]  For questions about your test or how to prepare for your test, please call: Cardiac Imaging Nurse Navigators Office: [phone]   Follow-Up: At [facility], you and your health needs are our priority.  As part of our continuing mission to provide you with exceptional heart care, we have created designated Provider Care Teams.  These Care Teams include your primary Cardiologist (physician) and Advanced Practice Providers (APPs -  Physician Assistants and Nurse Practitioners) who all work together to provide you with the care you need, when you need it.  We recommend signing up for the patient portal called MyChart.  Sign up information is provided on this After Visit Summary.  MyChart is used to connect with patients for Virtual Visits (Telemedicine).  Patients are able to view lab/test results, encounter notes, upcoming appointments, etc.  Non-urgent messages can be sent to your provider as well.   To learn more about what you can do with MyChart, go to [email or web address].    Your next appointment:    03/20/24 at 10:00 am (Tuesday)  Provider:   Soyla DELENA Merck, MD

## 2024-01-13 ENCOUNTER — Encounter: Payer: Self-pay | Admitting: Pharmacist

## 2024-01-13 ENCOUNTER — Ambulatory Visit: Payer: 59 | Attending: Cardiovascular Disease | Admitting: Pharmacist

## 2024-01-13 ENCOUNTER — Telehealth: Payer: Self-pay | Admitting: Pharmacist

## 2024-01-13 ENCOUNTER — Other Ambulatory Visit (HOSPITAL_COMMUNITY): Payer: Self-pay

## 2024-01-13 ENCOUNTER — Telehealth: Payer: Self-pay | Admitting: Pharmacy Technician

## 2024-01-13 DIAGNOSIS — E1169 Type 2 diabetes mellitus with other specified complication: Secondary | ICD-10-CM | POA: Diagnosis not present

## 2024-01-13 DIAGNOSIS — I2510 Atherosclerotic heart disease of native coronary artery without angina pectoris: Secondary | ICD-10-CM

## 2024-01-13 DIAGNOSIS — E785 Hyperlipidemia, unspecified: Secondary | ICD-10-CM | POA: Diagnosis not present

## 2024-01-13 DIAGNOSIS — R931 Abnormal findings on diagnostic imaging of heart and coronary circulation: Secondary | ICD-10-CM

## 2024-01-13 MED ORDER — REPATHA SURECLICK 140 MG/ML ~~LOC~~ SOAJ: 1.0000 mL | SUBCUTANEOUS | 1 refills | Status: DC

## 2024-01-13 NOTE — Telephone Encounter (Signed)
Please complete PA for Repatha 

## 2024-01-13 NOTE — Telephone Encounter (Signed)
Pharmacy Patient Advocate Encounter   Received notification from Pt Calls Messages that prior authorization for repatha is required/requested.   Insurance verification completed.   The patient is insured through [employer] .   Per test claim: PA required; PA submitted to above mentioned insurance via CoverMyMeds Key/confirmation #/[id number] [facility] Status is pending

## 2024-01-13 NOTE — Progress Notes (Signed)
Patient ID: Terry Barnett                 DOB: 01-26-1966                    MRN: [id number]     HPI: Terry Barnett is a 58 y.o. male patient referred to lipid clinic by Dr Jacques Navy. PMH is significant for HTN, T2DM, HLD, CAD, and elevated coronary calcium score. Patient has a significant family history of early CAD.  Patient presents today to discuss management of coronary artery disease. Has lost a considerable amount of weight on Ozempic and reports he feels well.   Father and grandfather had MI and passed away.  DM has been better controlled. Last A1c 6.7%.  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 Denies tobacco use.    Current Medications:  Atorvastatin 80mg  daily  Intolerances: N/A  Risk Factors:  Family history CAD T2DM Coronary calcium  LDL goal: <55  Labs: TC 119, Trigs 122, HDL 45, LDL 52 (11/10/23)  Imaging:  FINDINGS: Coronary Calcium Score:   Left main: 0   Left anterior descending artery: 858   Left circumflex artery: 13.7   Right coronary artery: 430   Total: 1302   Percentile: 99th   Normal coronary artery origins.   Pericardium: Normal.   Ascending Aorta: Upper limit of normal caliber. Ascending aorta measures approximately 39mm at the mid ascending aorta measured in a non-contrast axial plane.   Aortic valve: Moderately calcified, AV calcium score 664.    IMPRESSION: 1. Coronary calcium score of 1302. This was 99th percentile for age-, race-, and sex-matched controls.   2. Aortic valve calcium score 664. Moderate aortic valve calcifications.    Past Medical History:  Diagnosis Date   Diabetes mellitus without complication (HCC)    GERD (gastroesophageal reflux disease)    Hypercholesteremia    Hypertension    Obesity     Current Outpatient Medications on File Prior to Visit  Medication Sig Dispense Refill   ASPIRIN 81 PO Take 81 mg by mouth daily.     atorvastatin (LIPITOR) 80 MG tablet Take 80 mg by mouth daily.      dapagliflozin propanediol (FARXIGA) 10 MG TABS tablet Take 10 mg by mouth daily.     EPINEPHrine (EPIPEN 2-PAK) 0.3 mg/0.3 mL IJ SOAJ injection Inject 0.3 mg into the muscle once.     escitalopram (LEXAPRO) 10 MG tablet Take 10 mg by mouth daily.     esomeprazole (NEXIUM) 40 MG capsule Take 40 mg by mouth at bedtime.     fenofibrate micronized (LOFIBRA) 67 MG capsule Take 67 mg by mouth daily before breakfast.     levalbuterol (XOPENEX HFA) 45 MCG/ACT inhaler Inhale 2 puffs into the lungs every 4 (four) hours as needed for wheezing.     lisinopril-hydrochlorothiazide (PRINZIDE,ZESTORETIC) 20-12.5 MG tablet Take 1 tablet by mouth daily. (Patient not taking: Reported on 01/05/2024)     losartan (COZAAR) 50 MG tablet Take 50 mg by mouth daily.     metFORMIN (GLUCOPHAGE) 500 MG tablet Take 1,000 mg by mouth 2 (two) times daily with a meal. (Patient not taking: Reported on 01/05/2024)     Semaglutide (OZEMPIC, 1 MG/DOSE, [place]) Inject 1 mg into the skin once a week.     No current facility-administered medications on file prior to visit.    Allergies  Allergen Reactions   Peanuts [Peanut Oil] Anaphylaxis and Itching   Peanut-Containing Drug Products Other (See  Comments)   Sitagliptin-Metformin Hcl Rash    Spots on skin  GI issues.    Assessment/Plan:  1. Coronary Artery Disease - Patient last LDL well controlled. However has significant coronary calcium, 99th percentile. Due to family history has a strong risk of future CV events. Recommend starting PCSK9i to further reduce LDL and for plaque regression.  Using demo pen, educated patient on mechanism of action, storage, site selection, administration, and possible adverse effects. Will complete PA and contact patient with response. Recheck fasting lipid panel in 2-3 months.  Continue atorvastatin 80mg  daily Start Repatha 140mg  q 2 weeks Recheck lipid panel in 2-3 months  Laural Golden, PharmD, BCACP, CDCES, CPP [address], [address] [place], [place], [address] Phone: [phone], Fax: [phone]

## 2024-01-13 NOTE — Addendum Note (Signed)
Addended by: Cheree Ditto on: 01/13/2024 04:28 PM   Modules accepted: Orders

## 2024-01-13 NOTE — Patient Instructions (Addendum)
It was nice meeting you today  We would like your LDL (bad cholesterol) to be less than 55  Please continue your atorvastatin 80mg  once a day  The medication we discussed today is called Repatha which is an injection you would take once every 2 weeks  I will complete the prior authorization for you and contact you when it is approved  Once you start the medication we will recheck your fasting lipid panel in 2-3 months  Please let us know if you have any questions  Laural Golden, PharmD, BCACP, CDCES, CPP [address], [address] [place], [place], [address] Phone: [phone], Fax: [phone]

## 2024-01-13 NOTE — Telephone Encounter (Signed)
Pharmacy Patient Advocate Encounter  Received notification from [facility] that Prior Authorization for repatha has been APPROVED from 01/13/24 to 07/12/24   PA #/Case ID/Reference #: [id number]

## 2024-01-26 ENCOUNTER — Ambulatory Visit (HOSPITAL_COMMUNITY): Payer: 59 | Attending: Cardiology

## 2024-01-26 DIAGNOSIS — R011 Cardiac murmur, unspecified: Secondary | ICD-10-CM | POA: Diagnosis present

## 2024-01-26 LAB — ECHOCARDIOGRAM COMPLETE
AR max vel: 2.04 cm2
AV Area VTI: 2 cm2
AV Mean grad: 5.3 mm[Hg]
AV Peak grad: 10.1 mm[Hg]
Ao pk vel: 1.59 m/s
S' Lateral: 2.5 cm

## 2024-02-24 ENCOUNTER — Other Ambulatory Visit: Payer: Self-pay

## 2024-02-24 ENCOUNTER — Emergency Department (HOSPITAL_COMMUNITY)
Admission: EM | Admit: 2024-02-24 | Discharge: 2024-02-24 | Disposition: A | Discharge status: Home / Self Care | Payer: 59 | Attending: Emergency Medicine | Admitting: Emergency Medicine

## 2024-02-24 DIAGNOSIS — S0081XA Abrasion of other part of head, initial encounter: Secondary | ICD-10-CM | POA: Diagnosis not present

## 2024-02-24 DIAGNOSIS — X58XXXA Exposure to other specified factors, initial encounter: Secondary | ICD-10-CM | POA: Insufficient documentation

## 2024-02-24 DIAGNOSIS — I10 Essential (primary) hypertension: Secondary | ICD-10-CM | POA: Insufficient documentation

## 2024-02-24 DIAGNOSIS — I2510 Atherosclerotic heart disease of native coronary artery without angina pectoris: Secondary | ICD-10-CM | POA: Diagnosis not present

## 2024-02-24 DIAGNOSIS — Z91010 Allergy to peanuts: Secondary | ICD-10-CM | POA: Insufficient documentation

## 2024-02-24 DIAGNOSIS — Z7982 Long term (current) use of aspirin: Secondary | ICD-10-CM | POA: Diagnosis not present

## 2024-02-24 DIAGNOSIS — A084 Viral intestinal infection, unspecified: Secondary | ICD-10-CM | POA: Insufficient documentation

## 2024-02-24 DIAGNOSIS — R112 Nausea with vomiting, unspecified: Secondary | ICD-10-CM | POA: Insufficient documentation

## 2024-02-24 DIAGNOSIS — R55 Syncope and collapse: Secondary | ICD-10-CM | POA: Insufficient documentation

## 2024-02-24 DIAGNOSIS — E119 Type 2 diabetes mellitus without complications: Secondary | ICD-10-CM | POA: Diagnosis not present

## 2024-02-24 LAB — CBC
HCT: 51 % (ref 39.0–52.0)
Hemoglobin: 17.4 g/dL — ABNORMAL HIGH (ref 13.0–17.0)
MCH: 30.4 pg (ref 26.0–34.0)
MCHC: 34.1 g/dL (ref 30.0–36.0)
MCV: 89 fL (ref 80.0–100.0)
Platelets: 370 10*3/uL (ref 150–400)
RBC: 5.73 MIL/uL (ref 4.22–5.81)
RDW: 12.6 % (ref 11.5–15.5)
WBC: 18.6 10*3/uL — ABNORMAL HIGH (ref 4.0–10.5)
nRBC: 0 % (ref 0.0–0.2)

## 2024-02-24 LAB — URINALYSIS, ROUTINE W REFLEX MICROSCOPIC
Bacteria, UA: NONE SEEN
Bilirubin Urine: NEGATIVE
Glucose, UA: >=500 mg/dL — AB
Hgb urine dipstick: NEGATIVE
Ketones, ur: NEGATIVE mg/dL
Leukocytes,Ua: NEGATIVE
Nitrite: NEGATIVE
Protein, ur: NEGATIVE mg/dL
Specific Gravity, Urine: 1.025 (ref 1.005–1.030)
pH: 5 (ref 5.0–8.0)

## 2024-02-24 LAB — COMPREHENSIVE METABOLIC PANEL
ALT: 18 U/L (ref 0–44)
AST: 28 U/L (ref 15–41)
Albumin: 4 g/dL (ref 3.5–5.0)
Alkaline Phosphatase: 39 U/L (ref 38–126)
Anion gap: 13 (ref 5–15)
BUN: 17 mg/dL (ref 6–20)
CO2: 16 mmol/L — ABNORMAL LOW (ref 22–32)
Calcium: 8.9 mg/dL (ref 8.9–10.3)
Chloride: 112 mmol/L — ABNORMAL HIGH (ref 98–111)
Creatinine, Ser: 1.21 mg/dL (ref 0.61–1.24)
GFR, Estimated: >60 mL/min (ref >60)
Glucose, Bld: 132 mg/dL — ABNORMAL HIGH (ref 70–99)
Potassium: 3.7 mmol/L (ref 3.5–5.1)
Sodium: 141 mmol/L (ref 135–145)
Total Bilirubin: 0.9 mg/dL (ref 0.0–1.2)
Total Protein: 7.5 g/dL (ref 6.5–8.1)

## 2024-02-24 LAB — LIPASE, BLOOD: Lipase: 37 U/L (ref 11–51)

## 2024-02-24 MED ORDER — METOCLOPRAMIDE HCL 5 MG/ML IJ SOLN
10.0000 mg | Freq: Once | INTRAMUSCULAR | Status: AC
  Administered 2024-02-24: 10 mg via INTRAVENOUS
  Filled 2024-02-24: qty 2

## 2024-02-24 MED ORDER — LACTATED RINGERS IV BOLUS
1000.0000 mL | Freq: Once | INTRAVENOUS | Status: AC
  Administered 2024-02-24: 1000 mL via INTRAVENOUS

## 2024-02-24 MED ORDER — ONDANSETRON 4 MG PO TBDP: 4.0000 mg | ORAL_TABLET | Freq: Three times a day (TID) | ORAL | 0 refills | Status: AC | PRN

## 2024-02-24 NOTE — ED Triage Notes (Addendum)
 Pt presents fromhome for syncope x 2 after N/V/D this AM.  Endorses dizziness after "bearing down" with BM, neck pain, HA, abd pain, bilateral shoulder pain Denies blood in BM Tried zofran at 5a, with some improvement EMS VS: Laceration L eyebrow, 100/60, 102bpm, 20RR, 98% RA, 72CBG, 20 G L hand, delayed cap refill, received 700cc NS and 4mg  zofran with EMS, now improved 114/62 after fluids H/o DM States grandson had similar sx Not on a blood thinner  C collar placed in triage

## 2024-02-24 NOTE — ED Provider Notes (Signed)
 Care assumed from Dr. Anitra Lauth.  At time of transfer of care, patient awaiting reassessment after rehydration to make sure he does not get lightheaded or syncopal after having GI symptoms and dehydration.  Patient feeling much better now and was able to ambulate without lightheadedness.  He would like to go home now.  Patient will get prescription for Zofran ODT and rest and stay hydrated.  Patient and family no other questions or concerns and patient discharged in good condition.  Clinical Impression: 1. Viral gastroenteritis   2. Vasovagal syncope   3. Nausea and vomiting, unspecified vomiting type     Disposition: Discharge  Condition: Good  I have discussed the results, Dx and Tx plan with the pt(& family if present). He/she/they expressed understanding and agree(s) with the plan. Discharge instructions discussed at great length. Strict return precautions discussed and pt &/or family have verbalized understanding of the instructions. No further questions at time of discharge.    New Prescriptions   ONDANSETRON (ZOFRAN-ODT) 4 MG DISINTEGRATING TABLET    Take 1 tablet (4 mg total) by mouth every 8 (eight) hours as needed for nausea or vomiting.    Follow Up: Fatima Sanger, FNP [address] [place] [address] [phone]     [facility] Emergency Department at [facility] [address] [place] [address] [phone]         German Manke, Canary Brim, MD 02/24/24 [phone]

## 2024-02-24 NOTE — Discharge Instructions (Signed)
 Need to rest for the next day or 2 drink plenty of fluids.  You can take nausea medicine as needed.

## 2024-02-24 NOTE — ED Provider Notes (Signed)
 [place] EMERGENCY DEPARTMENT AT [facility] Provider Note   CSN: [id number] Arrival date & time: 02/24/24  1228     History  Chief Complaint  Patient presents with   Loss of Consciousness   Emesis    Terry Barnett is a 58 y.o. male.  Pt is a 58y/o male with hx of  HTN, T2DM, HLD, CAD, and obesity on ozempic who is presenting today due to multiple syncopal episodes at home in the setting of nausea vomiting and diarrhea.  Patient reports his grandson had similar symptoms 2 days ago.  He felt fine when he went to bed last night and then approximately 3:00 this morning he woke up with abdominal discomfort and nausea.  He reports at 8:00 he had an episode of vomiting and diarrhea and woke up on the floor.  He cleaned himself up and took a shower and went to bed and got up again a little later this morning and had another episode and woke up on the floor and at that time called his wife.  He reports prior to the syncope he always feels dizzy and lightheaded and knows he is going to pass out.  He denies prior history of passing out.  He is not having any chest pain, palpitations or shortness of breath.  He just complains of ongoing nausea.  He does get abdominal cramping right before having diarrhea or emesis but it then resolves.  Denies known bad food exposure.  Patient also had an episode here.  He told the nurse he was get a pass out and she noticed that patient went rigid for less than a minute and was unresponsive without change in heart rhythm on the monitor and then woke back up.  This is in the setting of having diarrhea.  Patient does take antihypertensives but has not had any today.  He denies any blood in his stool.  The history is provided by the patient and the EMS personnel.  Loss of Consciousness Associated symptoms: vomiting   Emesis      Home Medications Prior to Admission medications   Medication Sig Start Date End Date Taking? Authorizing Provider   ASPIRIN 81 PO Take 81 mg by mouth daily.    [provider]  atorvastatin (LIPITOR) 80 MG tablet Take 80 mg by mouth daily.    [provider]  dapagliflozin propanediol (FARXIGA) 10 MG TABS tablet Take 10 mg by mouth daily.    [provider]  EPINEPHrine (EPIPEN 2-PAK) 0.3 mg/0.3 mL IJ SOAJ injection Inject 0.3 mg into the muscle once.    [provider]  escitalopram (LEXAPRO) 10 MG tablet Take 10 mg by mouth daily.    [provider]  esomeprazole (NEXIUM) 40 MG capsule Take 40 mg by mouth at bedtime.    [provider]  Evolocumab (REPATHA SURECLICK) 140 MG/ML SOAJ Inject 140 mg into the skin every 14 (fourteen) days. 01/13/24   Parke Poisson, MD  fenofibrate micronized (LOFIBRA) 67 MG capsule Take 67 mg by mouth daily before breakfast.    [provider]  levalbuterol (XOPENEX HFA) 45 MCG/ACT inhaler Inhale 2 puffs into the lungs every 4 (four) hours as needed for wheezing.    [provider]  losartan (COZAAR) 50 MG tablet Take 50 mg by mouth daily.    [provider]  Semaglutide (OZEMPIC, 1 MG/DOSE, [place]) Inject 1 mg into the skin once a week.    [provider]  Allergies    Peanuts [peanut oil], Peanut-containing drug products, and Sitagliptin-metformin hcl    Review of Systems   Review of Systems  Cardiovascular:  Positive for syncope.  Gastrointestinal:  Positive for vomiting.    Physical Exam Updated Vital Signs BP 114/79   Pulse 97   Temp 97.8 F (36.6 C) (Oral)   Resp (!) 23   Wt 114 kg   SpO2 100%   BMI 36.06 kg/m  Physical Exam Vitals and nursing note reviewed.  Constitutional:      General: He is not in acute distress.    Appearance: He is well-developed. He is ill-appearing and diaphoretic.  HENT:     Head: Normocephalic and atraumatic.   Eyes:     Conjunctiva/sclera: Conjunctivae normal.     Pupils: Pupils are equal, round, and reactive to light.   Cardiovascular:     Rate and Rhythm: Normal rate and regular rhythm.     Heart sounds: No murmur heard. Pulmonary:     Effort: Pulmonary effort is normal. No respiratory distress.     Breath sounds: Normal breath sounds. No wheezing or rales.  Abdominal:     General: Bowel sounds are normal. There is distension.     Palpations: Abdomen is soft.     Tenderness: There is no abdominal tenderness. There is no guarding or rebound.  Musculoskeletal:        General: No tenderness. Normal range of motion.     Cervical back: Normal range of motion and neck supple.  Skin:    General: Skin is warm.     Coloration: Skin is pale.     Findings: No erythema or rash.  Neurological:     Mental Status: He is alert and oriented to person, place, and time. Mental status is at baseline.  Psychiatric:        Mood and Affect: Mood normal.        Behavior: Behavior normal.     ED Results / Procedures / Treatments   Labs (all labs ordered are listed, but only abnormal results are displayed) Labs Reviewed  COMPREHENSIVE METABOLIC PANEL - Abnormal; Notable for the following components:      Result Value   Chloride 112 (*)    CO2 16 (*)    Glucose, Bld 132 (*)    All other components within normal limits  CBC - Abnormal; Notable for the following components:   WBC 18.6 (*)    Hemoglobin 17.4 (*)    All other components within normal limits  URINALYSIS, ROUTINE W REFLEX MICROSCOPIC - Abnormal; Notable for the following components:   Glucose, UA >=500 (*)    All other components within normal limits  LIPASE, BLOOD    EKG EKG Interpretation Date/Time:  Friday February 24 2024 13:45:17 EST Ventricular Rate:  96 PR Interval:  221 QRS Duration:  85 QT Interval:  367 QTC Calculation: 464 R Axis:   52  Text Interpretation: Sinus rhythm Prolonged PR interval Posterior infarct, old Nonspecific T abnormalities, lateral leads No significant change since last tracing Confirmed by Gwyneth Sprout  [phone]) on 02/24/2024 1:49:44 PM  Radiology No results found.  Procedures Procedures    Medications Ordered in ED Medications  metoCLOPramide (REGLAN) injection 10 mg (10 mg Intravenous Given 02/24/24 1329)  lactated ringers bolus 1,000 mL (0 mLs Intravenous Stopped 02/24/24 1446)    ED Course/ Medical Decision Making/ A&P  Medical Decision Making Amount and/or Complexity of Data Reviewed Labs: ordered. Decision-making details documented in ED Course. ECG/medicine tests: ordered and independent interpretation performed. Decision-making details documented in ED Course.  Risk Prescription drug management.   Pt with multiple medical problems and comorbidities and presenting today with a complaint that caries a high risk for morbidity and mortality.  Here today with nausea vomiting and diarrhea and recurrent syncope.  Suspect patient is having vagal syncope related to his vomiting and diarrhea.  Concerned that vomiting and diarrhea may be viral in origin based on family member with similar symptoms 2 days ago.  Lower suspicion for perforation, PE, dysrhythmia as patient is continually on cardiac monitoring and even during a syncopal event had no change in rhythm.  He has no focal abdominal tenderness at this time.  Will ensure no evidence of atypical ACS, electrolyte abnormalities.  Patient given antiemetics.  Patient does have an abrasion to the left eyebrow area but low suspicion for intracranial hemorrhage at this time.  Mentating normally. I independently interpreted patient's EKG which showed a sinus rhythm with a first-degree AV block but no other acute findings.  3:08 PM I independently interpreted patient's labs and lipase within normal limits, CMP without acute findings today, CBC with leukocytosis of 18 which is most likely an acute phase reaction.  On repeat evaluation after fluids and Reglan patient reports he feels 100% better.  Will p.o. challenge  and ensure patient is able to ambulate without feelings of syncope.         Final Clinical Impression(s) / ED Diagnoses Final diagnoses:  Viral gastroenteritis  Vasovagal syncope    Rx / DC Orders ED Discharge Orders     None         Gwyneth Sprout, MD 02/25/24 1159

## 2024-02-24 NOTE — ED Notes (Signed)
 Pt ambulated with this NT from pts room to bathroom without episodes of feeling dizzy. Pt ambulated safely back to room and is in bed. RN notified.

## 2024-02-24 NOTE — ED Notes (Signed)
 Pt experienced syncopal episode while seated in WC. Had a bowel movement with this event. Moved to supine position in stretcher by staff at which time patient regained consciousness with baseline orientation, diaphoretic, vomiting. Pt brought immediately to exam room from EMS triage after this.

## 2024-02-27 ENCOUNTER — Encounter (HOSPITAL_COMMUNITY): Payer: Self-pay

## 2024-02-29 ENCOUNTER — Telehealth (HOSPITAL_COMMUNITY): Payer: Self-pay | Admitting: *Deleted

## 2024-02-29 NOTE — Telephone Encounter (Signed)
 Reaching out to patient to offer assistance regarding upcoming cardiac imaging study; pt verbalizes understanding of appt date/time, parking situation and where to check in, pre-test NPO status and verified current allergies; name and call back number provided for further questions should they arise  Larey Brick RN Navigator Cardiac Imaging [facility] [phone] office [phone] cell  Patient aware to avoid caffeine 12 hours prior to his PET scan.

## 2024-03-01 ENCOUNTER — Ambulatory Visit
Admission: RE | Admit: 2024-03-01 | Discharge: 2024-03-01 | Disposition: A | Discharge status: Home / Self Care | Payer: 59 | Source: Ambulatory Visit | Attending: Internal Medicine | Admitting: Internal Medicine

## 2024-03-01 DIAGNOSIS — I2510 Atherosclerotic heart disease of native coronary artery without angina pectoris: Secondary | ICD-10-CM

## 2024-03-01 DIAGNOSIS — I700 Atherosclerosis of aorta: Secondary | ICD-10-CM | POA: Diagnosis not present

## 2024-03-01 DIAGNOSIS — R931 Abnormal findings on diagnostic imaging of heart and coronary circulation: Secondary | ICD-10-CM | POA: Diagnosis present

## 2024-03-01 DIAGNOSIS — I358 Other nonrheumatic aortic valve disorders: Secondary | ICD-10-CM | POA: Diagnosis not present

## 2024-03-01 LAB — NM PET CT CARDIAC PERFUSION MULTI W/ABSOLUTE BLOODFLOW
MBFR: 3.2
Nuc Rest EF: 59 %
Nuc Stress EF: 65 %
Peak HR: 94 {beats}/min
Rest HR: 79 {beats}/min
Rest MBF: 0.92 ml/g/min
Rest Nuclear Isotope Dose: 25 mCi
SRS: 0
SSS: 0
ST Depression (mm): 0 mm
Stress MBF: 2.94 ml/g/min
Stress Nuclear Isotope Dose: 25 mCi
TID: 1.02

## 2024-03-01 MED ORDER — RUBIDIUM RB82 GENERATOR (RUBYFILL)
24.9600 | PACK | Freq: Once | INTRAVENOUS | Status: AC
  Administered 2024-03-01: 24.96 via INTRAVENOUS

## 2024-03-01 MED ORDER — RUBIDIUM RB82 GENERATOR (RUBYFILL)
24.9700 | PACK | Freq: Once | INTRAVENOUS | Status: AC
  Administered 2024-03-01: 24.97 via INTRAVENOUS

## 2024-03-01 MED ORDER — REGADENOSON 0.4 MG/5ML IV SOLN
INTRAVENOUS | Status: AC
  Filled 2024-03-01: qty 5

## 2024-03-01 MED ORDER — REGADENOSON 0.4 MG/5ML IV SOLN
0.4000 mg | Freq: Once | INTRAVENOUS | Status: AC
Start: 2024-03-01 — End: 2024-03-01
  Administered 2024-03-01: 0.4 mg via INTRAVENOUS
  Filled 2024-03-01: qty 5

## 2024-03-01 NOTE — Progress Notes (Signed)
 Patient presents for a cardiac PET stress test and tolerated procedure without incident. Patient maintained acceptable vital signs throughout the test and was offered caffeine after test.  Patient ambulated out of department with a steady gait.

## 2024-03-02 ENCOUNTER — Encounter: Payer: Self-pay | Admitting: *Deleted

## 2024-03-15 ENCOUNTER — Encounter: Payer: Self-pay | Admitting: *Deleted

## 2024-03-20 ENCOUNTER — Ambulatory Visit: Payer: 59 | Attending: Internal Medicine | Admitting: Internal Medicine

## 2024-03-20 VITALS — BP 118/64 | HR 91 | Ht 69.0 in | Wt 254.2 lb

## 2024-03-20 DIAGNOSIS — I10 Essential (primary) hypertension: Secondary | ICD-10-CM | POA: Diagnosis not present

## 2024-03-20 DIAGNOSIS — R011 Cardiac murmur, unspecified: Secondary | ICD-10-CM | POA: Diagnosis not present

## 2024-03-20 DIAGNOSIS — I358 Other nonrheumatic aortic valve disorders: Secondary | ICD-10-CM

## 2024-03-20 DIAGNOSIS — R931 Abnormal findings on diagnostic imaging of heart and coronary circulation: Secondary | ICD-10-CM

## 2024-03-20 DIAGNOSIS — Q239 Congenital malformation of aortic and mitral valves, unspecified: Secondary | ICD-10-CM

## 2024-03-20 DIAGNOSIS — Z79899 Other long term (current) drug therapy: Secondary | ICD-10-CM

## 2024-03-20 DIAGNOSIS — Z789 Other specified health status: Secondary | ICD-10-CM | POA: Diagnosis not present

## 2024-03-20 DIAGNOSIS — E785 Hyperlipidemia, unspecified: Secondary | ICD-10-CM

## 2024-03-20 DIAGNOSIS — E1169 Type 2 diabetes mellitus with other specified complication: Secondary | ICD-10-CM

## 2024-03-20 NOTE — Patient Instructions (Addendum)
 Medication Instructions:  Stop: Atorvastatin (Lipitor)  Lab Work: None  Testing/Procedures: Your physician has requested that you have an echocardiogram in about one year (to be done around 03/10/2025). Echocardiography is a painless test that uses sound waves to create images of your heart. It provides your doctor with information about the size and shape of your heart and how well your heart's chambers and valves are working. This procedure takes approximately one hour. There are no restrictions for this procedure. Please do NOT wear cologne, perfume, aftershave, or lotions (deodorant is allowed). Please arrive 15 minutes prior to your appointment time. This will take place at [address] [place]    Follow-Up: At [facility], you and your health needs are our priority.  As part of our continuing mission to provide you with exceptional heart care, we have created designated Provider Care Teams.  These Care Teams include your primary Cardiologist (physician) and Advanced Practice Providers (APPs -  Physician Assistants and Nurse Practitioners) who all work together to provide you with the care you need, when you need it.   Your next appointment:   6 month(s)  Provider:   Parke Poisson, MD

## 2024-04-01 NOTE — Progress Notes (Signed)
 Cardiology Office Note:  .   Date:  03/20/2024  ID:  Terry Barnett, DOB 1966-05-02, MRN [id number] PCP: Fatima Sanger, FNP  [facility] Providers Cardiologist:  Parke Poisson, MD    History of Present Illness: .   Terry Barnett is a 58 y.o. male.  Overall doing well. No worrisome chest pain. Mostly bothered by myalgias, likely secondary to statin. Advise statin holiday. The patient denies chest pain, chest pressure, dyspnea at rest or with exertion, palpitations, PND, orthopnea, or leg swelling. Denies cough, fever, chills. Denies nausea, vomiting. Denies syncope or presyncope. Denies dizziness or lightheadedness.    Initial consult: The patient, a Social research officer, government at [employer], was referred to cardiology for coronary artery calcifications with aortic valve calcifications. The patient has a history of diabetes, hypertension, and hyperlipidemia. The patient's father and both grandfathers died from heart disease, which raises concern for a genetic predisposition. The patient has been managing diabetes with Ozempic, Lantus, and Farxiga, and has lost significant weight. The patient also takes losartan for hypertension and atorvastatin and fenofibrate for hyperlipidemia. The patient's cholesterol levels have improved significantly, with LDL at 59, triglycerides at 225, and HDL at 34. The patient is asymptomatic but has a high coronary calcium score of 1302, indicating a risk of obstructive coronary artery disease. The patient also has aortic valve calcification, which will need to be monitored.        ROS: negative except per HPI above.  Studies Reviewed: .        Results         Risk Assessment/Calculations:          Physical Exam:   VS:  BP 118/64   Pulse 91   Ht 5\' 9"  (1.753 m)   Wt 254 lb 3.2 oz (115.3 kg)   SpO2 95%   BMI 37.54 kg/m    Wt Readings from Last 3 Encounters:  03/20/24 254 lb 3.2 oz (115.3 kg)  02/24/24 251 lb 5.2 oz (114 kg)  01/05/24  252 lb 9.6 oz (114.6 kg)      GEN: Well nourished, well developed in no acute distress NECK: No JVD; No carotid bruits CARDIAC: RRR, 1/6 systolic murmur RESPIRATORY:  Clear to auscultation without rales, wheezing or rhonchi  ABDOMEN: Soft, non-tender, non-distended EXTREMITIES:  No edema; No deformity   ASSESSMENT AND PLAN: .    Assessment & Plan Heart murmur, systolic  Statin intolerance  Elevated coronary artery calcium score  Hyperlipidemia associated with type 2 diabetes mellitus (HCC)  Primary hypertension  Medication management  Aortic valve sclerosis  Abnormality of aortic valve   Assessment and Plan  #systolic murmur #aortic valve sclerosis - mild murmur, AV sclerosis, would follow. Indeterminate cusps per interpretation. Obtain echo prior to next visit.  #Statin intolerance # medication management - myalgias with statin use. Lipids optimized on repatha and lipitor 80 mg daily. Stop lipitor and report symptoms after 1 mo.   #Elevated CAC #HLD - continue asa 81 mg daily and repatha 140 mg BID. Continue lofibra 67 mg daily.    #HTN - BP well controlled, continue losartan 50 mg daily     Patient Instructions  Medication Instructions:  Stop: Atorvastatin (Lipitor)  Lab Work: None  Testing/Procedures: Your physician has requested that you have an echocardiogram in about one year (to be done around 03/10/2025). Echocardiography is a painless test that uses sound waves to create images of your heart. It provides your doctor with information about the size and  shape of your heart and how well your heart's chambers and valves are working. This procedure takes approximately one hour. There are no restrictions for this procedure. Please do NOT wear cologne, perfume, aftershave, or lotions (deodorant is allowed). Please arrive 15 minutes prior to your appointment time. This will take place at [address] [place]    Follow-Up: At [facility],  you and your health needs are our priority.  As part of our continuing mission to provide you with exceptional heart care, we have created designated Provider Care Teams.  These Care Teams include your primary Cardiologist (physician) and Advanced Practice Providers (APPs -  Physician Assistants and Nurse Practitioners) who all work together to provide you with the care you need, when you need it.   Your next appointment:   6 month(s)  Provider:   Parke Poisson, MD

## 2024-06-08 LAB — LAB REPORT - SCANNED: EGFR: 79

## 2024-06-20 ENCOUNTER — Telehealth: Payer: Self-pay | Admitting: Internal Medicine

## 2024-06-20 NOTE — Telephone Encounter (Signed)
   Patient Name: Terry Barnett  DOB: 05/19/1966 MRN: [id number]  Primary Cardiologist: Soyla DELENA Merck, MD  Chart reviewed as part of pre-operative protocol coverage. Given past medical history and time since last visit, based on ACC/AHA guidelines, Terry Barnett is at acceptable risk for the planned procedure without further cardiovascular testing.   Recent PET stress test on 03/01/2024 was reassuring without any ischemia.  If needed, he may hold aspirin for 5 to 7 days prior to the procedure and restart as soon as possible afterward at the surgeon's discretion.  I will route this recommendation to the requesting party via Epic fax function and remove from pre-op pool.  Please call with questions.  Khylie Larmore, [place] 06/20/2024, 3:11 PM

## 2024-06-20 NOTE — Telephone Encounter (Signed)
   Pre-operative Risk Assessment    Patient Name: Terry Barnett  DOB: 10-25-1966 MRN: [id number]   Date of last office visit: 03/20/2024  Date of next office visit: 09/21/2024 / Request for Surgical Clearance    Procedure:  left total knee arthroplasty  Date of Surgery:  Clearance TBD                                Surgeon:  Dr. Elspeth Her Surgeon's Group or Practice Name:  [facility] Phone number:  [phone]  Duwaine Moats Fax number:  [phone]   Type of Clearance Requested:   - Medical  - Pharmacy:  Hold Aspirin ?(was not mentioned on clearance)   Type of Anesthesia:  choice   Additional requests/questions:    [name]   06/20/2024, 2:41 PM

## 2024-06-28 ENCOUNTER — Other Ambulatory Visit: Payer: Self-pay | Admitting: Internal Medicine

## 2024-06-28 DIAGNOSIS — R931 Abnormal findings on diagnostic imaging of heart and coronary circulation: Secondary | ICD-10-CM

## 2024-06-28 DIAGNOSIS — I2510 Atherosclerotic heart disease of native coronary artery without angina pectoris: Secondary | ICD-10-CM

## 2024-06-28 DIAGNOSIS — E1169 Type 2 diabetes mellitus with other specified complication: Secondary | ICD-10-CM

## 2024-07-09 ENCOUNTER — Other Ambulatory Visit: Payer: Self-pay | Admitting: Pharmacist

## 2024-07-09 DIAGNOSIS — I2510 Atherosclerotic heart disease of native coronary artery without angina pectoris: Secondary | ICD-10-CM

## 2024-07-09 DIAGNOSIS — E1169 Type 2 diabetes mellitus with other specified complication: Secondary | ICD-10-CM

## 2024-07-09 DIAGNOSIS — R931 Abnormal findings on diagnostic imaging of heart and coronary circulation: Secondary | ICD-10-CM

## 2024-07-09 MED ORDER — REPATHA SURECLICK 140 MG/ML ~~LOC~~ SOAJ: 1.0000 mL | SUBCUTANEOUS | 3 refills | Status: AC

## 2024-07-16 ENCOUNTER — Ambulatory Visit: Payer: Self-pay | Admitting: Internal Medicine

## 2024-09-10 ENCOUNTER — Encounter (HOSPITAL_COMMUNITY): Payer: Self-pay | Admitting: Emergency Medicine

## 2024-09-10 ENCOUNTER — Emergency Department (HOSPITAL_COMMUNITY)

## 2024-09-10 ENCOUNTER — Other Ambulatory Visit: Payer: Self-pay

## 2024-09-10 ENCOUNTER — Emergency Department (HOSPITAL_COMMUNITY)
Admission: EM | Admit: 2024-09-10 | Discharge: 2024-09-10 | Disposition: A | Discharge status: Home / Self Care | Attending: Emergency Medicine | Admitting: Emergency Medicine

## 2024-09-10 DIAGNOSIS — I10 Essential (primary) hypertension: Secondary | ICD-10-CM | POA: Insufficient documentation

## 2024-09-10 DIAGNOSIS — M79605 Pain in left leg: Secondary | ICD-10-CM | POA: Diagnosis present

## 2024-09-10 DIAGNOSIS — E119 Type 2 diabetes mellitus without complications: Secondary | ICD-10-CM | POA: Insufficient documentation

## 2024-09-10 DIAGNOSIS — I2699 Other pulmonary embolism without acute cor pulmonale: Secondary | ICD-10-CM | POA: Insufficient documentation

## 2024-09-10 DIAGNOSIS — Z91010 Allergy to peanuts: Secondary | ICD-10-CM | POA: Insufficient documentation

## 2024-09-10 DIAGNOSIS — Z7982 Long term (current) use of aspirin: Secondary | ICD-10-CM | POA: Diagnosis not present

## 2024-09-10 DIAGNOSIS — M79662 Pain in left lower leg: Secondary | ICD-10-CM | POA: Diagnosis not present

## 2024-09-10 DIAGNOSIS — I82402 Acute embolism and thrombosis of unspecified deep veins of left lower extremity: Secondary | ICD-10-CM | POA: Insufficient documentation

## 2024-09-10 DIAGNOSIS — I824Y2 Acute embolism and thrombosis of unspecified deep veins of left proximal lower extremity: Secondary | ICD-10-CM

## 2024-09-10 DIAGNOSIS — Z79899 Other long term (current) drug therapy: Secondary | ICD-10-CM | POA: Insufficient documentation

## 2024-09-10 LAB — CBC WITH DIFFERENTIAL/PLATELET
Abs Immature Granulocytes: 0.08 K/uL — ABNORMAL HIGH (ref 0.00–0.07)
Basophils Absolute: 0.1 K/uL (ref 0.0–0.1)
Basophils Relative: 1 %
Eosinophils Absolute: 0.2 K/uL (ref 0.0–0.5)
Eosinophils Relative: 3 %
HCT: 45.3 % (ref 39.0–52.0)
Hemoglobin: 14.8 g/dL (ref 13.0–17.0)
Immature Granulocytes: 1 %
Lymphocytes Relative: 20 %
Lymphs Abs: 1.5 K/uL (ref 0.7–4.0)
MCH: 28.9 pg (ref 26.0–34.0)
MCHC: 32.7 g/dL (ref 30.0–36.0)
MCV: 88.5 fL (ref 80.0–100.0)
Monocytes Absolute: 0.6 K/uL (ref 0.1–1.0)
Monocytes Relative: 9 %
Neutro Abs: 4.9 K/uL (ref 1.7–7.7)
Neutrophils Relative %: 66 %
Platelets: 344 K/uL (ref 150–400)
RBC: 5.12 MIL/uL (ref 4.22–5.81)
RDW: 12 % (ref 11.5–15.5)
WBC: 7.5 K/uL (ref 4.0–10.5)
nRBC: 0 % (ref 0.0–0.2)

## 2024-09-10 LAB — BASIC METABOLIC PANEL WITH GFR
Anion gap: 13 (ref 5–15)
BUN: 16 mg/dL (ref 6–20)
CO2: 22 mmol/L (ref 22–32)
Calcium: 9.4 mg/dL (ref 8.9–10.3)
Chloride: 99 mmol/L (ref 98–111)
Creatinine, Ser: 1.02 mg/dL (ref 0.61–1.24)
GFR, Estimated: >60 mL/min (ref >60)
Glucose, Bld: 113 mg/dL — ABNORMAL HIGH (ref 70–99)
Potassium: 4.4 mmol/L (ref 3.5–5.1)
Sodium: 133 mmol/L — ABNORMAL LOW (ref 135–145)

## 2024-09-10 LAB — PRO BRAIN NATRIURETIC PEPTIDE: Pro Brain Natriuretic Peptide: <50 pg/mL (ref <300.0)

## 2024-09-10 LAB — TROPONIN T, HIGH SENSITIVITY: Troponin T High Sensitivity: <15 ng/L (ref 0–19)

## 2024-09-10 MED ORDER — ACETAMINOPHEN 500 MG PO TABS
1000.0000 mg | ORAL_TABLET | Freq: Once | ORAL | Status: AC
  Administered 2024-09-10: 1000 mg via ORAL
  Filled 2024-09-10: qty 2

## 2024-09-10 MED ORDER — METHOCARBAMOL 500 MG PO TABS
1000.0000 mg | ORAL_TABLET | Freq: Once | ORAL | Status: AC
  Administered 2024-09-10: 1000 mg via ORAL
  Filled 2024-09-10: qty 2

## 2024-09-10 MED ORDER — APIXABAN (ELIQUIS) VTE STARTER PACK (10MG AND 5MG): ORAL_TABLET | ORAL | 0 refills | Status: DC

## 2024-09-10 MED ORDER — APIXABAN (ELIQUIS) VTE STARTER PACK (10MG AND 5MG): ORAL_TABLET | ORAL | 0 refills | Status: AC

## 2024-09-10 MED ORDER — IOHEXOL 350 MG/ML SOLN
75.0000 mL | Freq: Once | INTRAVENOUS | Status: AC | PRN
  Administered 2024-09-10: 75 mL via INTRAVENOUS

## 2024-09-10 MED ORDER — APIXABAN 2.5 MG PO TABS
10.0000 mg | ORAL_TABLET | Freq: Once | ORAL | Status: AC
  Administered 2024-09-10: 10 mg via ORAL
  Filled 2024-09-10: qty 4

## 2024-09-10 MED ORDER — OXYCODONE HCL 5 MG PO TABS
5.0000 mg | ORAL_TABLET | Freq: Four times a day (QID) | ORAL | Status: DC | PRN
Start: 2024-09-10 — End: 2024-09-11
  Administered 2024-09-10: 5 mg via ORAL
  Filled 2024-09-10: qty 1

## 2024-09-10 MED ORDER — APIXABAN (ELIQUIS) EDUCATION KIT FOR DVT/PE PATIENTS: PACK | Freq: Once | Status: DC

## 2024-09-10 NOTE — Progress Notes (Signed)
 Left lower extremity venous duplex has been completed.  Results can be found in chart review under CV Proc.  09/10/2024 4:03 PM  Eri Mcevers Elden Appl, RVT.

## 2024-09-10 NOTE — ED Provider Notes (Signed)
 [place] EMERGENCY DEPARTMENT AT [facility] Provider Note   CSN: [id number] Arrival date & time: 09/10/24  1300     Patient presents with: Post-op Problem   Terry Barnett is a 58 y.o. male.  With past medical history of hypertension, hyperlipidemia, type 2 diabetes, obesity status post total left knee replacement 09/03/24 presenting with concern of DVT PE.  Patient reports that he has had approximately 3 days of left calf cramping and pain.  Today during PT had an episode where he was biking and felt short of breath, nauseous and very dizzy.  Reportedly had low pulse ox reading but he is unsure what it was.  He does not have any history of DVT or PE and is not on blood thinner.  He is denying any chest pain, or  hemoptysis.  {Add pertinent medical, surgical, social history, OB history to HPI:32947} HPI     Prior to Admission medications   Medication Sig Start Date End Date Taking? Authorizing Provider  ASPIRIN 81 PO Take 81 mg by mouth daily.    [provider]  dapagliflozin propanediol (FARXIGA) 10 MG TABS tablet Take 10 mg by mouth daily.    [provider]  EPINEPHrine  (EPIPEN  2-PAK) 0.3 mg/0.3 mL IJ SOAJ injection Inject 0.3 mg into the muscle once.    [provider]  escitalopram (LEXAPRO) 10 MG tablet Take 10 mg by mouth daily.    [provider]  esomeprazole (NEXIUM) 40 MG capsule Take 40 mg by mouth at bedtime.    [provider]  Evolocumab  (REPATHA  SURECLICK) 140 MG/ML SOAJ Inject 140 mg into the skin every 14 (fourteen) days. 07/09/24   Acharya, Gayatri A, MD  fenofibrate micronized (LOFIBRA) 67 MG capsule Take 67 mg by mouth daily before breakfast.    [provider]  levalbuterol (XOPENEX HFA) 45 MCG/ACT inhaler Inhale 2 puffs into the lungs every 4 (four) hours as needed for wheezing.    [provider]  losartan (COZAAR) 50 MG tablet Take 50 mg by mouth daily.    [provider]   ondansetron  (ZOFRAN -ODT) 4 MG disintegrating tablet Take 1 tablet (4 mg total) by mouth every 8 (eight) hours as needed for nausea or vomiting. 02/24/24   Tegeler, Lonni PARAS, MD  Semaglutide (OZEMPIC, 1 MG/DOSE, [place]) Inject 1 mg into the skin once a week.    [provider]    Allergies: Peanuts [peanut oil], Peanut-containing drug products, and Sitagliptin phos-metformin hcl    Review of Systems  Musculoskeletal:  Positive for arthralgias.    Updated Vital Signs BP (!) 118/93   Pulse 88   Temp 98.6 F (37 C) (Oral)   Resp 16   SpO2 100%   Physical Exam Vitals and nursing note reviewed.  Constitutional:      General: He is not in acute distress.    Appearance: He is not toxic-appearing.  HENT:     Head: Normocephalic and atraumatic.  Eyes:     General: No scleral icterus.    Conjunctiva/sclera: Conjunctivae normal.  Cardiovascular:     Rate and Rhythm: Normal rate and regular rhythm.     Pulses: Normal pulses.     Heart sounds: Normal heart sounds.  Pulmonary:     Effort: Pulmonary effort is normal. No respiratory distress.     Breath sounds: Normal breath sounds.  Abdominal:     General: Abdomen is flat. Bowel sounds are normal.     Palpations: Abdomen is soft.  Tenderness: There is no abdominal tenderness.  Musculoskeletal:     Right lower leg: No edema.     Left lower leg: Edema present.     Comments: Strong DP pulse   Skin:    General: Skin is warm and dry.     Findings: No lesion.  Neurological:     General: No focal deficit present.     Mental Status: He is alert and oriented to person, place, and time. Mental status is at baseline.     (all labs ordered are listed, but only abnormal results are displayed) Labs Reviewed  CBC WITH DIFFERENTIAL/PLATELET - Abnormal; Notable for the following components:      Result Value   Abs Immature Granulocytes 0.08 (*)    All other components within normal limits  BASIC METABOLIC PANEL WITH GFR  PRO  BRAIN NATRIURETIC PEPTIDE  TROPONIN T, HIGH SENSITIVITY    EKG: None  Radiology: No results found.  {Document cardiac monitor, telemetry assessment procedure when appropriate:32947} Procedures   Medications Ordered in the ED - No data to display  Clinical Course as of 09/10/24 2227  Mon Sep 10, 2024  1508 Positive for DVT of LLE [JB]  1707 Unilateral PE small, no right heart strain.  [JB]  1804 Ambulated with steady gait, reports no SOB, no increased RR, normal O2 [JB]    Clinical Course User Index [JB] [place] Lesure, Warren SAILOR, PA-C   {Click here for ABCD2, HEART and other calculators REFRESH Note before signing:1}                              Medical Decision Making Amount and/or Complexity of Data Reviewed Labs: ordered. Radiology: ordered.  Risk OTC drugs. Prescription drug management.   This patient presents to the ED for concern of shortness of breath, this involves an extensive number of treatment options, and is a complaint that carries with it a high risk of complications and morbidity.  The differential diagnosis includes CHF, PE, pneumonia, pneumothorax, pulmonary embolus, asthma, COPD   Co morbidities that complicate the patient evaluation  ***   Additional history obtained:  Additional history obtained from ***   Lab Tests:  I personally interpreted labs.  The pertinent results include:  ***   Imaging Studies ordered:  I ordered imaging studies including chest x-ray I independently visualized and interpreted imaging which showed *** I agree with the radiologist interpretation   Cardiac Monitoring: / EKG:  The patient was maintained on a cardiac monitor.  I personally viewed and interpreted the cardiac monitored which showed an underlying rhythm of: ***   Consultations Obtained:  I requested consultation with the ***,  and discussed lab and imaging findings as well as pertinent plan - they recommend: ***   Problem List / ED Course / Critical  interventions / Medication management  *** I ordered medication including ***  for ***  Reevaluation of the patient after these medicines showed that the patient {resolved/improved/worsened:23923::improved} I have reviewed the patients home medicines and have made adjustments as needed   {Document critical care time when appropriate  Document review of labs and clinical decision tools ie CHADS2VASC2, etc  Document your independent review of radiology images and any outside records  Document your discussion with family members, caretakers and with consultants  Document social determinants of health affecting pt's care  Document your decision making why or why not admission, treatments were needed:32947:::1}   Final diagnoses:  None    ED Discharge Orders     None

## 2024-09-10 NOTE — Discharge Instructions (Addendum)
 You will take 10mg  of Eliquis  twice a day (every 12 hours) for 7 days THEN take 5mg  of Eliquis  twice a day (every 12 hours).  Return with new or worsening symptoms including worsening chest pain shortness of breath or near syncope.Information about your medication: Anticoagulant  Generic Name (Brand): Apixaban  (Eliquis )  Apixaban  is used to reduce the risk of forming blood clots that cause a stroke due to an irregular heartbeat.  Common SIDE EFFECTS you may experience include: extremity pain and increased risk of bleeding or bruising.  This drug must be taken consistently as prescribed to maintain its effect.  Multiple drug-drug interactions exist for this medication.  Consult healthcare professional prior to starting a new drug.  Tell your physicians and dentists that you are taking this drug before elective surgery or invasive procedures and before any new drug is prescribed.  Contact your health care provider if you experience: any signs of blood loss or unusual bleeding .Information on my medicine - ELIQUIS  (apixaban )  This medication education was reviewed with me or my healthcare representative as part of my discharge preparation.  Why was Eliquis  prescribed for you? Eliquis  was prescribed to treat blood clots that may have been found in the veins of your legs (deep vein thrombosis) or in your lungs (pulmonary embolism) and to reduce the risk of them occurring again.  What do You need to know about Eliquis  ? The starting dose is 10 mg (two 5 mg tablets) taken TWICE daily for the FIRST SEVEN (7) DAYS, then   the dose is reduced to ONE 5 mg tablet taken TWICE daily.  Eliquis  may be taken with or without food.   Try to take the dose about the same time in the morning and in the evening. If you have difficulty swallowing the tablet whole please discuss with your pharmacist how to take the medication safely.  Take Eliquis  exactly as prescribed and DO NOT stop taking Eliquis   without talking to the doctor who prescribed the medication.  Stopping may increase your risk of developing a new blood clot.  Refill your prescription before you run out.  After discharge, you should have regular check-up appointments with your healthcare provider that is prescribing your Eliquis .    What do you do if you miss a dose? If a dose of ELIQUIS  is not taken at the scheduled time, take it as soon as possible on the same day and twice-daily administration should be resumed. The dose should not be doubled to make up for a missed dose.  Important Safety Information A possible side effect of Eliquis  is bleeding. You should call your healthcare provider right away if you experience any of the following: Bleeding from an injury or your nose that does not stop. Unusual colored urine (red or dark brown) or unusual colored stools (red or black). Unusual bruising for unknown reasons. A serious fall or if you hit your head (even if there is no bleeding).  Some medicines may interact with Eliquis  and might increase your risk of bleeding or clotting while on Eliquis . To help avoid this, consult your healthcare provider or pharmacist prior to using any new prescription or non-prescription medications, including herbals, vitamins, non-steroidal anti-inflammatory drugs (NSAIDs) and supplements.  This website has more information on Eliquis  (apixaban ): http://[email or web address] .com/eliquis dena

## 2024-09-10 NOTE — ED Triage Notes (Signed)
 BIB EMS from [facility] where pt was having PT. Had knee replacement last Monday. During PT had shob, dizziness, and reportedly his pulse ox was low.  Concerned for DVT.  Pt is taking robaxin  and tylenol  as well as oxycodone  for pain.  Pain to left knee and calf.  Knee is warm to touch. VSS

## 2024-09-16 ENCOUNTER — Emergency Department (HOSPITAL_BASED_OUTPATIENT_CLINIC_OR_DEPARTMENT_OTHER): Admission: EM | Admit: 2024-09-16 | Discharge: 2024-09-16 | Disposition: A | Discharge status: Home / Self Care

## 2024-09-16 ENCOUNTER — Encounter (HOSPITAL_BASED_OUTPATIENT_CLINIC_OR_DEPARTMENT_OTHER): Payer: Self-pay | Admitting: Emergency Medicine

## 2024-09-16 ENCOUNTER — Emergency Department (HOSPITAL_BASED_OUTPATIENT_CLINIC_OR_DEPARTMENT_OTHER)

## 2024-09-16 DIAGNOSIS — Z7982 Long term (current) use of aspirin: Secondary | ICD-10-CM | POA: Insufficient documentation

## 2024-09-16 DIAGNOSIS — E119 Type 2 diabetes mellitus without complications: Secondary | ICD-10-CM | POA: Diagnosis not present

## 2024-09-16 DIAGNOSIS — Z91010 Allergy to peanuts: Secondary | ICD-10-CM | POA: Diagnosis not present

## 2024-09-16 DIAGNOSIS — Z79899 Other long term (current) drug therapy: Secondary | ICD-10-CM | POA: Insufficient documentation

## 2024-09-16 DIAGNOSIS — M79605 Pain in left leg: Secondary | ICD-10-CM | POA: Diagnosis present

## 2024-09-16 DIAGNOSIS — I10 Essential (primary) hypertension: Secondary | ICD-10-CM | POA: Diagnosis not present

## 2024-09-16 HISTORY — DX: Other pulmonary embolism without acute cor pulmonale: I26.99

## 2024-09-16 HISTORY — DX: Acute embolism and thrombosis of unspecified deep veins of unspecified lower extremity: I82.409

## 2024-09-16 LAB — CBC WITH DIFFERENTIAL/PLATELET
Abs Immature Granulocytes: 0.07 K/uL (ref 0.00–0.07)
Basophils Absolute: 0.1 K/uL (ref 0.0–0.1)
Basophils Relative: 1 %
Eosinophils Absolute: 0.3 K/uL (ref 0.0–0.5)
Eosinophils Relative: 4 %
HCT: 41.7 % (ref 39.0–52.0)
Hemoglobin: 14.1 g/dL (ref 13.0–17.0)
Immature Granulocytes: 1 %
Lymphocytes Relative: 29 %
Lymphs Abs: 2.2 K/uL (ref 0.7–4.0)
MCH: 29.3 pg (ref 26.0–34.0)
MCHC: 33.8 g/dL (ref 30.0–36.0)
MCV: 86.7 fL (ref 80.0–100.0)
Monocytes Absolute: 0.5 K/uL (ref 0.1–1.0)
Monocytes Relative: 6 %
Neutro Abs: 4.6 K/uL (ref 1.7–7.7)
Neutrophils Relative %: 59 %
Platelets: 393 K/uL (ref 150–400)
RBC: 4.81 MIL/uL (ref 4.22–5.81)
RDW: 12.3 % (ref 11.5–15.5)
WBC: 7.8 K/uL (ref 4.0–10.5)
nRBC: 0 % (ref 0.0–0.2)

## 2024-09-16 LAB — COMPREHENSIVE METABOLIC PANEL WITH GFR
ALT: 16 U/L (ref 0–44)
AST: 21 U/L (ref 15–41)
Albumin: 4.2 g/dL (ref 3.5–5.0)
Alkaline Phosphatase: 61 U/L (ref 38–126)
Anion gap: 11 (ref 5–15)
BUN: 14 mg/dL (ref 6–20)
CO2: 25 mmol/L (ref 22–32)
Calcium: 9.3 mg/dL (ref 8.9–10.3)
Chloride: 102 mmol/L (ref 98–111)
Creatinine, Ser: 0.94 mg/dL (ref 0.61–1.24)
GFR, Estimated: >60 mL/min (ref >60)
Glucose, Bld: 81 mg/dL (ref 70–99)
Potassium: 4.3 mmol/L (ref 3.5–5.1)
Sodium: 138 mmol/L (ref 135–145)
Total Bilirubin: 0.5 mg/dL (ref 0.0–1.2)
Total Protein: 7.7 g/dL (ref 6.5–8.1)

## 2024-09-16 NOTE — ED Provider Notes (Cosign Needed Addendum)
 [place] EMERGENCY DEPARTMENT AT [facility] Provider Note   CSN: [id number] Arrival date & time: 09/16/24  1124     Patient presents with: Leg Pain   Terry Barnett is a 58 y.o. male history of hypertension, hyperlipidemia, type 2 diabetes status post TKA on 09/03/2024 presents with complaints of left leg pain.  Patient was evaluated on 9/15 for similar complaints.  Was found to have a left lower extremity DVT as well as a PE.  He was started on Eliquis  at that time.  Reports total compliance since then.  Notes that yesterday he started developing a dull aching pain in his popliteal fossa yesterday.  No new injury or trauma.  Has been compliant with PT and.  Progressing well.  No new numbness or tingling.    Leg Pain     Past Medical History:  Diagnosis Date   Diabetes mellitus without complication (HCC)    DVT (deep venous thrombosis) (HCC)    GERD (gastroesophageal reflux disease)    Hypercholesteremia    Hypertension    Obesity    Pulmonary embolism (HCC)    Past Surgical History:  Procedure Laterality Date   CHOLECYSTECTOMY     FOOT FRACTURE SURGERY     HERNIA REPAIR     KNEE SURGERY       Prior to Admission medications   Medication Sig Start Date End Date Taking? Authorizing Provider  APIXABAN  (ELIQUIS ) VTE STARTER PACK (10MG  AND 5MG ) Take as directed on package: start with two-5mg  tablets twice daily for 7 days. On day 8, switch to one-5mg  tablet twice daily. 09/10/24   Barrett, Jamie N, PA-C  ASPIRIN 81 PO Take 81 mg by mouth daily.    [provider]  dapagliflozin propanediol (FARXIGA) 10 MG TABS tablet Take 10 mg by mouth daily.    [provider]  EPINEPHrine  (EPIPEN  2-PAK) 0.3 mg/0.3 mL IJ SOAJ injection Inject 0.3 mg into the muscle once.    [provider]  escitalopram (LEXAPRO) 10 MG tablet Take 10 mg by mouth daily.    [provider]  esomeprazole (NEXIUM) 40 MG capsule Take 40 mg by mouth at bedtime.     [provider]  Evolocumab  (REPATHA  SURECLICK) 140 MG/ML SOAJ Inject 140 mg into the skin every 14 (fourteen) days. 07/09/24   Acharya, Gayatri A, MD  fenofibrate micronized (LOFIBRA) 67 MG capsule Take 67 mg by mouth daily before breakfast.    [provider]  levalbuterol (XOPENEX HFA) 45 MCG/ACT inhaler Inhale 2 puffs into the lungs every 4 (four) hours as needed for wheezing.    [provider]  losartan (COZAAR) 50 MG tablet Take 50 mg by mouth daily.    [provider]  ondansetron  (ZOFRAN -ODT) 4 MG disintegrating tablet Take 1 tablet (4 mg total) by mouth every 8 (eight) hours as needed for nausea or vomiting. 02/24/24   Tegeler, Lonni PARAS, MD  Semaglutide (OZEMPIC, 1 MG/DOSE, [place]) Inject 1 mg into the skin once a week.    [provider]    Allergies: Peanuts [peanut oil], Peanut-containing drug products, and Sitagliptin phos-metformin hcl    Review of Systems  Musculoskeletal:  Positive for myalgias.    Updated Vital Signs BP 115/68   Pulse 91   Temp 98.4 F (36.9 C) (Oral)   Resp 18   Ht 5' 9 (1.753 m)   Wt 109.8 kg   SpO2 99%   BMI 35.74 kg/m   Physical Exam Vitals and  nursing note reviewed.  Constitutional:      General: He is not in acute distress.    Appearance: He is well-developed.  HENT:     Head: Normocephalic and atraumatic.  Eyes:     Conjunctiva/sclera: Conjunctivae normal.  Cardiovascular:     Rate and Rhythm: Normal rate and regular rhythm.     Heart sounds: No murmur heard. Pulmonary:     Effort: Pulmonary effort is normal. No respiratory distress.     Breath sounds: Normal breath sounds.  Abdominal:     Palpations: Abdomen is soft.     Tenderness: There is no abdominal tenderness.  Musculoskeletal:     Cervical back: Neck supple.     Comments: Expected swelling involving left knee, tolerates range of motion of the knee without discomfort, incision appears clean and dry without any dehiscence or  overlying erythema, positive Homans, DP/PT pulses 2+  Skin:    General: Skin is warm and dry.     Capillary Refill: Capillary refill takes less than 2 seconds.  Neurological:     Mental Status: He is alert.  Psychiatric:        Mood and Affect: Mood normal.     (all labs ordered are listed, but only abnormal results are displayed) Labs Reviewed - No data to display  EKG: None  Radiology: No results found.   Procedures   Medications Ordered in the ED - No data to display  Clinical Course as of 09/16/24 1635  Sun Sep 16, 2024  1512 Patient status post TKA on 9/8 with postoperative PE and DVT on Eliquis  evaluated for worsening left lower extremity pain that began yesterday.  He is hemodynamically stable.  On exam he has tenderness to the left popliteal space with positive Homans. Otherwise his exam is consistent with postoperative knee with no evidence of active infection.  Ultrasound is pending at this time.  Will obtain routine labs to evaluate for any significant leukocytosis.  However at this time lower suspicion for septic arthritis. [JT]  1535 CBC with Differential Unremarkable [JT]  1631 US  Venous Img Lower Unilateral Left Notable for DVT that appears to be unchanged.  No involvement of the popliteal fossa. [JT]  1634 Comprehensive metabolic panel Unremarkable [JT]  1634 Overall workup reassuring.  Patient will be discharged home.  Encouraged to continue Eliquis  as prescribed and follow-up with his surgeon as scheduled.  Patient and his wife are understanding agreement plan. [JT]    Clinical Course User Index [JT] Donnajean Lynwood DEL, PA-C                                 Medical Decision Making Amount and/or Complexity of Data Reviewed Labs: ordered. Decision-making details documented in ED Course. Radiology:  Decision-making details documented in ED Course.   This patient presents to the ED with chief complaint(s) of leg pain .  The complaint involves an extensive  differential diagnosis and also carries with it a high risk of complications and morbidity.   Pertinent past medical history as listed in HPI  The differential diagnosis includes  Considered septic joint however patient's exam is not consistent with this he has no leukocytosis and is afebrile.  Considered progressive clot burden however there is no evidence of this on duplex study. Additional history obtained: Records reviewed Care Everywhere/External Records  Disposition:   Patient will be discharged home. The patient has been appropriately medically screened and/or stabilized  in the ED. I have low suspicion for any other emergent medical condition which would require further screening, evaluation or treatment in the ED or require inpatient management. At time of discharge the patient is hemodynamically stable and in no acute distress. I have discussed work-up results and diagnosis with patient and answered all questions. Patient is agreeable with discharge plan. We discussed strict return precautions for returning to the emergency department and they verbalized understanding.     Social Determinants of Health:   none  This note was dictated with voice recognition software.  Despite best efforts at proofreading, errors may have occurred which can change the documentation meaning.       Final diagnoses:  None    ED Discharge Orders     None          Donnajean Lynwood DEL, PA-C 09/16/24 1636    Donnajean Lynwood DEL, PA-C 09/16/24 1711    Geraldene Hamilton, MD 09/17/24 [phone]

## 2024-09-16 NOTE — ED Triage Notes (Signed)
 Pt dx with LLE DVT and PE on 9/15; reports increased pain in leg and pain extends further up leg now (s/p total knee 9/8)

## 2024-09-16 NOTE — Discharge Instructions (Signed)
 You were evaluated in the emergency room for left leg pain.  Your blood clot appears to be unchanged.  There is no evidence of infection on your exam or lab work.  Please follow-up with your surgeon as scheduled.  If you experience any new or worsening symptoms please return to emergency room.

## 2024-09-21 ENCOUNTER — Ambulatory Visit: Attending: Internal Medicine | Admitting: Internal Medicine

## 2024-09-21 VITALS — BP 110/60 | HR 114 | Ht 69.0 in | Wt 243.8 lb

## 2024-09-21 DIAGNOSIS — E119 Type 2 diabetes mellitus without complications: Secondary | ICD-10-CM

## 2024-09-21 DIAGNOSIS — I10 Essential (primary) hypertension: Secondary | ICD-10-CM

## 2024-09-21 DIAGNOSIS — E1169 Type 2 diabetes mellitus with other specified complication: Secondary | ICD-10-CM

## 2024-09-21 DIAGNOSIS — R931 Abnormal findings on diagnostic imaging of heart and coronary circulation: Secondary | ICD-10-CM

## 2024-09-21 DIAGNOSIS — Z789 Other specified health status: Secondary | ICD-10-CM | POA: Diagnosis not present

## 2024-09-21 DIAGNOSIS — R011 Cardiac murmur, unspecified: Secondary | ICD-10-CM

## 2024-09-21 DIAGNOSIS — E785 Hyperlipidemia, unspecified: Secondary | ICD-10-CM

## 2024-09-21 DIAGNOSIS — Z6836 Body mass index (BMI) 36.0-36.9, adult: Secondary | ICD-10-CM

## 2024-09-21 DIAGNOSIS — I358 Other nonrheumatic aortic valve disorders: Secondary | ICD-10-CM

## 2024-09-21 DIAGNOSIS — E66812 Obesity, class 2: Secondary | ICD-10-CM

## 2024-09-21 DIAGNOSIS — I77810 Thoracic aortic ectasia: Secondary | ICD-10-CM

## 2024-09-21 NOTE — Progress Notes (Signed)
 Cardiology Office Note:  .   Date:  09/21/2024  ID:  Terry Barnett, DOB 1966/08/10, MRN [id number] PCP: Royden Ronal Czar, FNP  [facility] Providers Cardiologist:  Soyla DELENA Merck, MD    History of Present Illness: .   Terry Barnett is a 58 y.o. male.  Discussed the use of AI scribe software for clinical note transcription with the patient, who gave verbal consent to proceed.  History of Present Illness Terry Barnett is a 58 year old male who presents with postoperative pulmonary embolism and deep vein thrombosis.  He underwent total knee arthroplasty recently, leading to a pulmonary embolism and deep vein thrombosis. A CT angiogram of the chest on September 15th showed a small volume, non-occlusive thrombus in the segmental branches of the middle lobe without right heart strain.  He experiences persistent left leg pain, described as a constant throbbing that worsens when the leg is not elevated. Pain management includes leg elevation, icing, methocarbamol , oxycodone , and Tylenol . He is on Eliquis  for anticoagulation.  He has coronary artery calcifications, aortic valve calcifications, diabetes, hypertension, and hyperlipidemia. His coronary calcium score is 1302. An echocardiogram in January 2025 showed normal biventricular chamber size and function, indeterminate aortic valve cusps, no aortic valve stenosis, and borderline aortic dilation.  He manages cholesterol with Repatha  and and lofibra 67 mg daily. He takes losartan 50 mg daily and semaglutide for diabetes. Blood pressure is stable, and hemoglobin A1c was 5.4 in March, with recent blood sugars around 120-125. He has stopped atorvastatin due to discomfort and is not taking aspirin while on Eliquis .  No shortness of breath currently.    ROS: negative except per HPI above.  Studies Reviewed: .        Results LABS Coronary calcium score: 1302 Hemoglobin A1c: 5.4 (March 2025) LDL: 32 Triglycerides:  159  RADIOLOGY Angio chest CT: Small volume, non-occlusive thrombus in segmental branches of the middle lobe, no right heart strain (09/10/2024)  DIAGNOSTIC Echocardiogram: Normal biventricular chamber size and function, indeterminate aortic valve cusps, no evidence of aortic valve stenosis, borderline dilation of the aorta (January 2025) Risk Assessment/Calculations:       Physical Exam:   VS:  BP 110/60   Pulse (!) 114   Ht 5' 9 (1.753 m)   Wt 243 lb 12.8 oz (110.6 kg)   SpO2 98%   BMI 36.00 kg/m    Wt Readings from Last 3 Encounters:  09/21/24 243 lb 12.8 oz (110.6 kg)  09/16/24 242 lb (109.8 kg)  03/20/24 254 lb 3.2 oz (115.3 kg)     Physical Exam GENERAL: Alert, cooperative, well developed, no acute distress. HEENT: Normocephalic, normal oropharynx, moist mucous membranes. CHEST: Clear to auscultation bilaterally, no wheezes, rhonchi, or crackles. CARDIOVASCULAR: Normal heart rate and rhythm, S1 and S2 normal soft systolic murmur. ABDOMEN: Soft, non-tender, non-distended, without organomegaly, normal bowel sounds. EXTREMITIES: No cyanosis or edema. MUSCULOSKELETAL: Knee incision healing well. NEUROLOGICAL: Cranial nerves grossly intact, moves all extremities without gross motor or sensory deficit.   ASSESSMENT AND PLAN: .    Assessment and Plan Assessment & Plan Acute pulmonary embolism of right lung segmental branches and left lower extremity deep vein thrombosis post-total knee arthroplasty Recent postoperative PE and DVT with small volume, non-occlusive thrombus in right lung segmental branches. No right heart strain. On Eliquis  for anticoagulation. - Continue Eliquis  for 3-6 months, defer to Heme for duration upcoming appt for evaluation of genetic clotting disorders. - Monitor for worsening symptoms or new  symptoms such as chest pain or shortness of breath.  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 Managed with elevation, icing, methocarbamol , and oxycodone . - Continue pain management per ortho. - Consult orthopedic surgeon for further evaluation of knee and leg pain.  Coronary artery and aortic valve calcification without evidence of aortic stenosis Calcifications present without aortic stenosis. Echocardiogram showed normal biventricular function. - Schedule follow up echocardiogram when he is more comfortable. - Resume aspirin after completing Eliquis  therapy.  Borderline aortic root dilation Borderline dilation noted on echocardiogram. - Follow up with echocardiogram as planned.  Type 2 diabetes mellitus, well controlled Well controlled with hemoglobin A1c of 5.4%. Blood sugars stable. Off semaglutide due to nausea, but control maintained. - Monitor blood sugar levels. - Resume semaglutide after consultation with Doctor Kay.  Hypertension, well controlled Well controlled on losartan 50 mg daily. Stable blood pressure readings. - Continue losartan 50 mg daily. - Monitor blood pressure regularly.  Hyperlipidemia with statin intolerance, managed with non-statin therapy Managed with Repatha  and fenofibrate. LDL at 32 mg/dL. Triglycerides near target. - Continue Repatha  and fenofibrate. - Monitor lipid levels regularly.      Soyla Merck, MD, FACC

## 2024-09-21 NOTE — Patient Instructions (Signed)
 Medication Instructions:  Your physician recommends that you continue on your current medications as directed. Please refer to the Current Medication list given to you today.  *If you need a refill on your cardiac medications before your next appointment, please call your pharmacy*  Lab Work: None ordered  If you have labs (blood work) drawn today and your tests are completely normal, you will receive your results only by: MyChart Message (if you have MyChart) OR A paper copy in the mail If you have any lab test that is abnormal or we need to change your treatment, we will call you to review the results.  Testing/Procedures: None ordered  Follow-Up: At [facility], you and your health needs are our priority.  As part of our continuing mission to provide you with exceptional heart care, our providers are all part of one team.  This team includes your primary Cardiologist (physician) and Advanced Practice Providers or APPs (Physician Assistants and Nurse Practitioners) who all work together to provide you with the care you need, when you need it.  Your next appointment:   6 month(s)  Provider:   Gayatri A Acharya, MD    We recommend signing up for the patient portal called MyChart.  Sign up information is provided on this After Visit Summary.  MyChart is used to connect with patients for Virtual Visits (Telemedicine).  Patients are able to view lab/test results, encounter notes, upcoming appointments, etc.  Non-urgent messages can be sent to your provider as well.   To learn more about what you can do with MyChart, go to [email or web address].   Other Instructions

## 2024-10-23 ENCOUNTER — Other Ambulatory Visit: Payer: Self-pay | Admitting: *Deleted

## 2024-10-23 DIAGNOSIS — Z8249 Family history of ischemic heart disease and other diseases of the circulatory system: Secondary | ICD-10-CM

## 2024-10-24 ENCOUNTER — Inpatient Hospital Stay: Attending: Internal Medicine | Admitting: Internal Medicine

## 2024-10-24 ENCOUNTER — Inpatient Hospital Stay

## 2024-10-24 VITALS — BP 120/73 | HR 101 | Temp 98.3°F | Resp 17 | Ht 69.0 in | Wt 253.0 lb

## 2024-10-24 DIAGNOSIS — Z7901 Long term (current) use of anticoagulants: Secondary | ICD-10-CM | POA: Diagnosis not present

## 2024-10-24 DIAGNOSIS — I2699 Other pulmonary embolism without acute cor pulmonale: Secondary | ICD-10-CM | POA: Insufficient documentation

## 2024-10-24 DIAGNOSIS — Z8249 Family history of ischemic heart disease and other diseases of the circulatory system: Secondary | ICD-10-CM

## 2024-10-24 DIAGNOSIS — I82442 Acute embolism and thrombosis of left tibial vein: Secondary | ICD-10-CM | POA: Diagnosis present

## 2024-10-24 DIAGNOSIS — I82402 Acute embolism and thrombosis of unspecified deep veins of left lower extremity: Secondary | ICD-10-CM | POA: Insufficient documentation

## 2024-10-24 LAB — CBC WITH DIFFERENTIAL (CANCER CENTER ONLY)
Abs Immature Granulocytes: 0.04 K/uL (ref 0.00–0.07)
Basophils Absolute: 0.1 K/uL (ref 0.0–0.1)
Basophils Relative: 2 %
Eosinophils Absolute: 0.2 K/uL (ref 0.0–0.5)
Eosinophils Relative: 3 %
HCT: 44.7 % (ref 39.0–52.0)
Hemoglobin: 15.5 g/dL (ref 13.0–17.0)
Immature Granulocytes: 1 %
Lymphocytes Relative: 37 %
Lymphs Abs: 2 K/uL (ref 0.7–4.0)
MCH: 29.8 pg (ref 26.0–34.0)
MCHC: 34.7 g/dL (ref 30.0–36.0)
MCV: 86 fL (ref 80.0–100.0)
Monocytes Absolute: 0.4 K/uL (ref 0.1–1.0)
Monocytes Relative: 8 %
Neutro Abs: 2.7 K/uL (ref 1.7–7.7)
Neutrophils Relative %: 49 %
Platelet Count: 358 K/uL (ref 150–400)
RBC: 5.2 MIL/uL (ref 4.22–5.81)
RDW: 12.7 % (ref 11.5–15.5)
WBC Count: 5.5 K/uL (ref 4.0–10.5)
nRBC: 0 % (ref 0.0–0.2)

## 2024-10-24 NOTE — Progress Notes (Signed)
 [facility] [phone]   [phone]  CONSULT NOTE  REFERRING PHYSICIAN: Ronal Leeroy Marie, FNP  REASON FOR CONSULTATION:  58 years old white male recently diagnosed with deep venous thrombosis and pulmonary embolism  HPI Terry Barnett is a 58 y.o. male.   HPI  Discussed the use of AI scribe software for clinical note transcription with the patient, who gave verbal consent to proceed.  History of Present Illness Terry Barnett is a 58 year old male who presents for evaluation of left deep venous thrombosis and a small pulmonary embolism. He is accompanied by his wife, Clotilda. He was referred to the emergency room by his orthopedic surgeon following symptoms during physical therapy.  Seven weeks ago, he underwent knee replacement surgery. After his second physical therapy session, he experienced soreness, swelling, and fever in his left calf, along with shortness of breath during therapy. This led to an immediate cessation of therapy and a referral to the emergency room.  A Doppler ultrasound of the lower extremities revealed deep venous thrombosis in the posterior tibial vein from mid to distal. A CT angiogram of the chest showed a small, non-occlusive blood clot in the segmental branch of the middle lobe of the right lung. A repeat Doppler a week later confirmed the clot was still present.  He was started on Eliquis  in the emergency room, initially at 10 mg twice a day for the first week, followed by 5 mg twice a day. He was not on any blood thinners prior to this event. He had been taking aspirin prophylactically around the time of his knee surgery but was not prescribed any other anticoagulants like Eliquis  or Lovenox post-surgery.  He has a history of high blood pressure, diabetes, acid reflux, high cholesterol, and moderate obesity. 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  No current chest pain, shortness of breath, nausea, vomiting, or bleeding. He continues to experience some knee discomfort and tenderness in the calf area but otherwise feels fine.     Past Medical History:  Diagnosis Date   Diabetes mellitus without complication (HCC)    DVT (deep venous thrombosis) (HCC)    GERD (gastroesophageal reflux disease)    Hypercholesteremia    Hypertension    Obesity    Pulmonary embolism (HCC)       Past Surgical History:  Procedure Laterality Date   CHOLECYSTECTOMY     FOOT FRACTURE SURGERY     HERNIA REPAIR     KNEE SURGERY      Family History  Problem Relation Age of Onset   Heart attack Father    Hypertension Father     Social History Social History   Tobacco Use   Smoking status: Never   Smokeless tobacco: Never  Vaping Use   Vaping status: Never Used  Substance Use Topics   Alcohol use: No   Drug use: No    Allergies  Allergen Reactions   Peanuts [Peanut Oil] Anaphylaxis and Itching   Peanut-Containing Drug Products Other (See Comments)   Sitagliptin Phos-Metformin Hcl Rash    Spots on skin  GI issues.    Current Outpatient Medications  Medication Sig Dispense Refill   APIXABAN  (ELIQUIS ) VTE STARTER PACK (10MG  AND 5MG ) Take as directed on package: start with two-5mg  tablets twice daily for 7  days. On day 8, switch to one-5mg  tablet twice daily. 74 each 0   dapagliflozin propanediol (FARXIGA) 10 MG TABS tablet Take 10 mg by mouth daily.     EPINEPHrine  (EPIPEN  2-PAK) 0.3 mg/0.3 mL IJ SOAJ injection Inject 0.3 mg into the muscle once.     escitalopram (LEXAPRO) 10 MG tablet Take 10 mg by mouth daily.     esomeprazole (NEXIUM) 40 MG capsule Take 40 mg by mouth at bedtime.     Evolocumab  (REPATHA  SURECLICK) 140 MG/ML SOAJ Inject 140 mg into the skin every 14 (fourteen) days. 6 mL 3   fenofibrate micronized (LOFIBRA) 67 MG capsule Take  67 mg by mouth daily before breakfast.     levalbuterol (XOPENEX HFA) 45 MCG/ACT inhaler Inhale 2 puffs into the lungs every 4 (four) hours as needed for wheezing.     losartan (COZAAR) 50 MG tablet Take 50 mg by mouth daily.     ondansetron  (ZOFRAN -ODT) 4 MG disintegrating tablet Take 1 tablet (4 mg total) by mouth every 8 (eight) hours as needed for nausea or vomiting. 20 tablet 0   Semaglutide (OZEMPIC, 1 MG/DOSE, [place]) Inject 1 mg into the skin once a week.     No current facility-administered medications for this visit.    Review of Systems  Constitutional: negative Eyes: negative Ears, nose, mouth, throat, and face: negative Respiratory: negative Cardiovascular: negative Gastrointestinal: negative Genitourinary:negative Integument/breast: negative Hematologic/lymphatic: negative Musculoskeletal:positive for myalgias Neurological: negative Behavioral/Psych: negative Endocrine: negative Allergic/Immunologic: negative  Physical Exam  MJO:jozmu, healthy, no distress, well nourished, and well developed SKIN: skin color, texture, turgor are normal, no rashes or significant lesions HEAD: Normocephalic, No masses, lesions, tenderness or abnormalities EYES: normal, PERRLA, Conjunctiva are pink and non-injected EARS: External ears normal, Canals clear OROPHARYNX:no exudate, no erythema, and lips, buccal mucosa, and tongue normal  NECK: supple, no adenopathy, no JVD LYMPH:  no palpable lymphadenopathy, no hepatosplenomegaly LUNGS: clear to auscultation , and palpation HEART: regular rate & rhythm, no murmurs, and no gallops ABDOMEN:abdomen soft, non-tender, obese, normal bowel sounds, and no masses or organomegaly BACK: Back symmetric, no curvature., No CVA tenderness EXTREMITIES:no joint deformities, effusion, or inflammation, no edema  NEURO: alert & oriented x 3 with fluent speech, no focal motor/sensory deficits  PERFORMANCE STATUS: ECOG 1  LABORATORY DATA: Lab Results   Component Value Date   WBC 5.5 10/24/2024   HGB 15.5 10/24/2024   HCT 44.7 10/24/2024   MCV 86.0 10/24/2024   PLT 358 10/24/2024      Chemistry      Component Value Date/Time   NA 138 09/16/2024 1513   K 4.3 09/16/2024 1513   CL 102 09/16/2024 1513   CO2 25 09/16/2024 1513   BUN 14 09/16/2024 1513   CREATININE 0.94 09/16/2024 1513      Component Value Date/Time   CALCIUM 9.3 09/16/2024 1513   ALKPHOS 61 09/16/2024 1513   AST 21 09/16/2024 1513   ALT 16 09/16/2024 1513   BILITOT 0.5 09/16/2024 1513       RADIOGRAPHIC STUDIES: No results found.  ASSESSMENT AND PLAN: Assessment and Plan Assessment & Plan Provoked left lower extremity deep vein thrombosis and right segmental pulmonary embolism The DVT in the left lower extremity and the small, non-occlusive PE in the right lung are provoked due to recent knee replacement surgery. The DVT is located in the posterior tibial vein from mid to distal, and the PE is in the segmental branch of the middle lobe. The condition is  expected given recent surgery, body weight, and other comorbidities. No family history of blood clots. The situation is typical for post-surgical patients. - Continue Eliquis  5 mg twice daily for six months due to the provoked nature of the clots and body weight. - Consider repeating Doppler ultrasound of the left lower extremity in three months. - If another clot occurs, consider testing for hereditary clotting disorders after discontinuation of anticoagulation therapy for at least one month. The patient was advised to call immediately if he has any other concerning symptoms in the interval.  The patient voices understanding of current disease status and treatment options and is in agreement with the current care plan.  All questions were answered. The patient knows to call the clinic with any problems, questions or concerns. We can certainly see the patient much sooner if necessary.  Thank you so much for  allowing me to participate in the care of [name]. I will continue to follow up the patient with you and assist in his care.  The total time spent in the appointment was 60 minutes including review of chart and various tests results, discussions about plan of care and coordination of care plan .   Disclaimer: This note was dictated with voice recognition software. Similar sounding words can inadvertently be transcribed and may not be corrected upon review.   Sherrod MARLA Sherrod October 24, 2024, 12:00 PM

## 2024-10-25 ENCOUNTER — Telehealth: Payer: Self-pay

## 2024-10-25 NOTE — Telephone Encounter (Signed)
 Spoke with Powell from [employer]. She called to confirm that the patient attended his appointment yesterday, which he did. Patient currently does not have a next appointment scheduled.  Heather requested that Dr. Jeannett note be faxed to her at [phone]. She voiced thanks.

## 2024-12-18 LAB — LAB REPORT - SCANNED
A1c: 5.9
EGFR: 67

## 2025-01-22 ENCOUNTER — Ambulatory Visit: Payer: Self-pay | Admitting: Internal Medicine

## 2025-01-28 ENCOUNTER — Ambulatory Visit (HOSPITAL_COMMUNITY): Admission: RE | Admit: 2025-01-28 | Source: Ambulatory Visit

## 2025-02-08 ENCOUNTER — Ambulatory Visit (HOSPITAL_COMMUNITY)
